# Patient Record
Sex: Male | Born: 1995 | Race: Black or African American | Hispanic: No | Marital: Single | Smoking: Current some day smoker
Health system: Southern US, Community
[De-identification: ages and names within clinical notes are randomized; demographics above are authoritative.]

## PROBLEM LIST (undated history)

## (undated) DIAGNOSIS — A549 Gonococcal infection, unspecified: Secondary | ICD-10-CM

---

## 2017-08-21 ENCOUNTER — Emergency Department (HOSPITAL_COMMUNITY)
Admission: EM | Admit: 2017-08-21 | Discharge: 2017-08-21 | Disposition: A | Discharge status: Home / Self Care | Payer: BLUE CROSS/BLUE SHIELD | Attending: Emergency Medicine | Admitting: Emergency Medicine

## 2017-08-21 ENCOUNTER — Other Ambulatory Visit: Payer: Self-pay

## 2017-08-21 ENCOUNTER — Encounter (HOSPITAL_COMMUNITY): Payer: Self-pay | Admitting: Emergency Medicine

## 2017-08-21 DIAGNOSIS — K6289 Other specified diseases of anus and rectum: Secondary | ICD-10-CM | POA: Insufficient documentation

## 2017-08-21 DIAGNOSIS — R208 Other disturbances of skin sensation: Secondary | ICD-10-CM

## 2017-08-21 LAB — URINALYSIS, ROUTINE W REFLEX MICROSCOPIC
Bilirubin Urine: NEGATIVE
Glucose, UA: NEGATIVE mg/dL
Hgb urine dipstick: NEGATIVE
Ketones, ur: NEGATIVE mg/dL
Leukocytes, UA: NEGATIVE
NITRITE: NEGATIVE
PH: 6 (ref 5.0–8.0)
Protein, ur: NEGATIVE mg/dL
SPECIFIC GRAVITY, URINE: 1.014 (ref 1.005–1.030)

## 2017-08-21 LAB — COMPREHENSIVE METABOLIC PANEL
ALT: 32 U/L (ref 17–63)
ANION GAP: 10 (ref 5–15)
AST: 37 U/L (ref 15–41)
Albumin: 4.2 g/dL (ref 3.5–5.0)
Alkaline Phosphatase: 73 U/L (ref 38–126)
BUN: 10 mg/dL (ref 6–20)
CALCIUM: 9.2 mg/dL (ref 8.9–10.3)
CO2: 27 mmol/L (ref 22–32)
Chloride: 102 mmol/L (ref 101–111)
Creatinine, Ser: 0.92 mg/dL (ref 0.61–1.24)
GLUCOSE: 79 mg/dL (ref 65–99)
POTASSIUM: 3.9 mmol/L (ref 3.5–5.1)
Sodium: 139 mmol/L (ref 135–145)
TOTAL PROTEIN: 7.5 g/dL (ref 6.5–8.1)
Total Bilirubin: 1.3 mg/dL — ABNORMAL HIGH (ref 0.3–1.2)

## 2017-08-21 LAB — CBC
HEMATOCRIT: 47.2 % (ref 39.0–52.0)
HEMOGLOBIN: 16.3 g/dL (ref 13.0–17.0)
MCH: 28.9 pg (ref 26.0–34.0)
MCHC: 34.5 g/dL (ref 30.0–36.0)
MCV: 83.7 fL (ref 78.0–100.0)
Platelets: 209 10*3/uL (ref 150–400)
RBC: 5.64 MIL/uL (ref 4.22–5.81)
RDW: 13.2 % (ref 11.5–15.5)
WBC: 7 10*3/uL (ref 4.0–10.5)

## 2017-08-21 LAB — LIPASE, BLOOD: LIPASE: 26 U/L (ref 11–51)

## 2017-08-21 MED ORDER — STARCH 51 % RE SUPP: 1.0000 | RECTAL | 0 refills | Status: DC | PRN

## 2017-08-21 NOTE — Discharge Instructions (Signed)
Blood work was normal.  Prescription for suppository for your rectal pain.  Try to get a primary care doctor.

## 2017-08-21 NOTE — ED Provider Notes (Deleted)
[facility] Provider Note   CSN: [id number] Arrival date & time: 08/21/17  1430     History   Chief Complaint Chief Complaint  Patient presents with  . Abdominal Pain    HPI Dorinda HillMalik Fadeley is a 22 y.o. male.  Lower abdominal pain for 2 weeks with associated rectal irritation scribed as a burning sensation.  Review of systems positive for a small amount of blood in stool.  He is normally healthy.  No chronic medical conditions.  No abdominal surgery.  He is eating normally.  No weight loss.  Severity is mild.  Nothing makes symptoms better or worse.      History reviewed. No pertinent past medical history.  There are no active problems to display for this patient.   History reviewed. No pertinent surgical history.     Home Medications    Prior to Admission medications   Medication Sig Start Date End Date Taking? Authorizing Provider  acetaminophen (TYLENOL) 500 MG tablet Take 500 mg by mouth every 6 (six) hours as needed for mild pain or moderate pain.   Yes [provider]  starch (ANUSOL) 51 % suppository Place 1 suppository rectally as needed for pain. 08/21/17   Donnetta Hutchingook, Fayth Trefry, MD    Family History History reviewed. No pertinent family history.  Social History Social History   Tobacco Use  . Smoking status: Never Smoker  . Smokeless tobacco: Never Used  Substance Use Topics  . Alcohol use: No    Frequency: Never  . Drug use: Not on file     Allergies   Patient has no known allergies.   Review of Systems Review of Systems  All other systems reviewed and are negative.    Physical Exam Updated Vital Signs BP 129/79   Pulse (!) 104   Temp 98.3 F (36.8 C) (Oral)   Resp 18   Ht 6' 0.5" (1.842 m)   Wt 91 kg (200 lb 9.6 oz)   SpO2 97%   BMI 26.83 kg/m   Physical Exam  Constitutional: He is oriented to person, place, and time. He appears well-developed and well-nourished.  HENT:  Head: Normocephalic and  atraumatic.  Eyes: Conjunctivae are normal.  Neck: Neck supple.  Cardiovascular: Normal rate and regular rhythm.  Pulmonary/Chest: Effort normal and breath sounds normal.  Abdominal: Soft. Bowel sounds are normal.  Genitourinary:  Genitourinary Comments: Rectal exam: No masses, heme-negative.  Musculoskeletal: Normal range of motion.  Neurological: He is alert and oriented to person, place, and time.  Skin: Skin is warm and dry.  Psychiatric: He has a normal mood and affect. His behavior is normal.  Nursing note and vitals reviewed.    ED Treatments / Results  Labs (all labs ordered are listed, but only abnormal results are displayed) Labs Reviewed  COMPREHENSIVE METABOLIC PANEL - Abnormal; Notable for the following components:      Result Value   Total Bilirubin 1.3 (*)    All other components within normal limits  LIPASE, BLOOD  CBC  URINALYSIS, ROUTINE W REFLEX MICROSCOPIC  POC OCCULT BLOOD, ED    EKG  EKG Interpretation None       Radiology No results found.  Procedures Procedures (including critical care time)  Medications Ordered in ED Medications - No data to display   Initial Impression / Assessment and Plan / ED Course  I have reviewed the triage vital signs and the nursing notes.  Pertinent labs & imaging results that were available during my  care of the patient were reviewed by me and considered in my medical decision making (see chart for details).     Patient has a normal physical exam.  Labs, rectal exam all normal.  Discharge medications Anusol HC suppository.  Final Clinical Impressions(s) / ED Diagnoses   Final diagnoses:  Rectal burning    ED Discharge Orders        Ordered    starch (ANUSOL) 51 % suppository  As needed     08/21/17 2140       Donnetta Hutching, MD 08/21/17 2202

## 2017-08-21 NOTE — ED Triage Notes (Signed)
Patient c/o lower abd pain x2 weeks. Denies any nausea, vomiting, or fevers. Per patient diarrhea and burning sensation in rectum prior to BM. Patient has noted intermittent, small amount of bright red blood in stools.

## 2017-08-21 NOTE — ED Notes (Signed)
Occult blood card negative.  Results did not transfer over from glucose monitor

## 2017-08-22 LAB — POC OCCULT BLOOD, ED: Fecal Occult Bld: NEGATIVE

## 2017-08-24 NOTE — ED Provider Notes (Signed)
[facility] Provider [facility]   CSN: [id number] Arrival date & time: 08/21/17  1430     History   Chief Complaint Chief Complaint  Patient presents with  . Abdominal Pain    HPI Chad Bolton is a 22 y.o. male.  Lower abdominal pain for 2 weeks with associated rectal burning.  Small amount of blood in stool.  He is eating normally.  No nausea, vomiting, diarrhea.  No weight loss.  No chronic health problems.  Severity of symptoms is mild.  Nothing makes symptoms better or worse.      History reviewed. No pertinent past medical history.  There are no active problems to display for this patient.   History reviewed. No pertinent surgical history.     Home Medications    Prior to Admission medications   Medication Sig Start Date End Date Taking? Authorizing Provider  acetaminophen (TYLENOL) 500 MG tablet Take 500 mg by mouth every 6 (six) hours as needed for mild pain or moderate pain.   Yes [provider]  starch (ANUSOL) 51 % suppository Place 1 suppository rectally as needed for pain. 08/21/17   Donnetta Hutchingook, Fumiko Cham, MD    Family History History reviewed. No pertinent family history.  Social History Social History   Tobacco Use  . Smoking status: Never Smoker  . Smokeless tobacco: Never Used  Substance Use Topics  . Alcohol use: No    Frequency: Never  . Drug use: Not on file     Allergies   Patient has no known allergies.   Review of Systems Review of Systems  All other systems reviewed and are negative.    Physical Exam Updated Vital Signs BP 129/79   Pulse (!) 104   Temp 98.3 F (36.8 C) (Oral)   Resp 18   Ht 6' 0.5" (1.842 m)   Wt 91 kg (200 lb 9.6 oz)   SpO2 97%   BMI 26.83 kg/m   Physical Exam  Constitutional: He is oriented to person, place, and time. He appears well-developed and well-nourished.  HENT:  Head: Normocephalic and atraumatic.  Eyes: Conjunctivae are normal.  Neck: Neck supple.  Cardiovascular:  Normal rate and regular rhythm.  Pulmonary/Chest: Effort normal and breath sounds normal.  Abdominal: Soft. Bowel sounds are normal.  Genitourinary:  Genitourinary Comments: Rectal exam: No masses, heme-negative  Musculoskeletal: Normal range of motion.  Neurological: He is alert and oriented to person, place, and time.  Skin: Skin is warm and dry.  Psychiatric: He has a normal mood and affect. His behavior is normal.  Nursing note and vitals reviewed.    ED Treatments / Results  Labs (all labs ordered are listed, but only abnormal results are displayed) Labs Reviewed  COMPREHENSIVE METABOLIC PANEL - Abnormal; Notable for the following components:      Result Value   Total Bilirubin 1.3 (*)    All other components within normal limits  LIPASE, BLOOD  CBC  URINALYSIS, ROUTINE W REFLEX MICROSCOPIC  POC OCCULT BLOOD, ED    EKG  EKG Interpretation None       Radiology No results found.  Procedures Procedures (including critical care time)  Medications Ordered in ED Medications - No data to display   Initial Impression / Assessment and Plan / ED Course  I have reviewed the triage vital signs and the nursing notes.  Pertinent labs & imaging results that were available during my care of the patient were reviewed by me and considered in my medical  decision making (see chart for details).     Patient is in no acute distress.  Rectal exam revealed no obvious acute pathology.  Hemoglobin and occult blood normal.  Discharge medications Anusol suppository.  Final Clinical Impressions(s) / ED Diagnoses   Final diagnoses:  Rectal burning    ED Discharge Orders        Ordered    starch (ANUSOL) 51 % suppository  As needed     08/21/17 2140       Donnetta Hutching, MD 08/24/17 [phone]

## 2018-07-21 ENCOUNTER — Other Ambulatory Visit: Payer: Self-pay

## 2018-07-21 ENCOUNTER — Emergency Department (HOSPITAL_COMMUNITY)
Admission: EM | Admit: 2018-07-21 | Discharge: 2018-07-21 | Disposition: A | Discharge status: Home / Self Care | Payer: BLUE CROSS/BLUE SHIELD | Attending: Emergency Medicine | Admitting: Emergency Medicine

## 2018-07-21 ENCOUNTER — Encounter (HOSPITAL_COMMUNITY): Payer: Self-pay | Admitting: *Deleted

## 2018-07-21 DIAGNOSIS — J020 Streptococcal pharyngitis: Secondary | ICD-10-CM | POA: Diagnosis not present

## 2018-07-21 DIAGNOSIS — J029 Acute pharyngitis, unspecified: Secondary | ICD-10-CM | POA: Diagnosis present

## 2018-07-21 LAB — GROUP A STREP BY PCR: Group A Strep by PCR: DETECTED — AB

## 2018-07-21 MED ORDER — PENICILLIN G BENZATHINE & PROC 1200000 UNIT/2ML IM SUSP
1.2000 10*6.[IU] | Freq: Once | INTRAMUSCULAR | Status: AC
  Administered 2018-07-21: 1.2 10*6.[IU] via INTRAMUSCULAR
  Filled 2018-07-21: qty 2

## 2018-07-21 MED ORDER — ACETAMINOPHEN 325 MG PO TABS
650.0000 mg | ORAL_TABLET | Freq: Once | ORAL | Status: AC
  Administered 2018-07-21: 650 mg via ORAL
  Filled 2018-07-21: qty 2

## 2018-07-21 MED ORDER — LIDOCAINE VISCOUS HCL 2 % MT SOLN: 15.0000 mL | OROMUCOSAL | 0 refills | Status: DC | PRN

## 2018-07-21 MED ORDER — DEXAMETHASONE 1 MG/ML PO CONC
10.0000 mg | Freq: Once | ORAL | Status: AC
  Administered 2018-07-21: 10 mg via ORAL
  Filled 2018-07-21: qty 10

## 2018-07-21 NOTE — Discharge Instructions (Addendum)
You have been diagnosed today with Strep Throat.  At this time there does not appear to be the presence of an emergent medical condition, however there is always the potential for conditions to change. Please read and follow the below instructions.  Please return to the Emergency Department immediately for any new or worsening symptoms or if your symptoms do not improve within 48 hours. Please be sure to follow up with your Primary Care Provider within 5 days regarding your visit today; please call their office to schedule an appointment even if you are feeling better for a follow-up visit. You may use the viscous lidocaine mouthwash to help with your sore throat.  Do not swallow this medication. You have been treated today with antibiotic medication penicillin.  Your symptoms should start to improve in the next 24 hours, if your symptoms have not improved in the next 48 hours please return to the emergency department as further evaluation may be necessary.  Get help right away if: You have new symptoms, such as vomiting, severe headache, stiff or painful neck, chest pain, or shortness of breath. You have severe throat pain, drooling, or changes in your voice. You have swelling of the neck, or the skin on the neck becomes red and tender. You have signs of dehydration, such as fatigue, dry mouth, and decreased urination. You become increasingly sleepy, or you cannot wake up completely. Your joints become red or painful.  Please read the additional information packets attached to your discharge summary.  Do not take your medicine if  develop an itchy rash, swelling in your mouth or lips, or difficulty breathing.

## 2018-07-21 NOTE — ED Provider Notes (Signed)
[facility] Provider Note   CSN: [id number] Arrival date & time: 07/21/18  [id number]     History   Chief Complaint Chief Complaint  Patient presents with  . Sore Throat    HPI Chad Bolton is a 22 y.o. male presents today for a 4-day history of sore throat.  Patient states that his sore throat has gradually gotten worse since onset.  Describes it as a bilateral mild intensity constant burning sensation worse with swallowing.  Patient has been using NyQuil with some relief, last NyQuil dose last night.  States that he has been otherwise feeling well, denies fever, nausea/vomiting or abdominal pain.  Patient states that he has been eating and drinking without difficulty, last oral intake just prior to arrival.  HPI  History reviewed. No pertinent past medical history.  There are no active problems to display for this patient.   History reviewed. No pertinent surgical history.      Home Medications    Prior to Admission medications   Medication Sig Start Date End Date Taking? Authorizing Provider  acetaminophen (TYLENOL) 500 MG tablet Take 500 mg by mouth every 6 (six) hours as needed for mild pain or moderate pain.    [provider]  lidocaine (XYLOCAINE) 2 % solution Use as directed 15 mLs in the mouth or throat as needed for mouth pain (Do NOT Swallow). 07/21/18   Harlene SaltsMorelli, Javion Holmer A, PA-C  starch (ANUSOL) 51 % suppository Place 1 suppository rectally as needed for pain. 08/21/17   Donnetta Hutchingook, Brian, MD    Family History History reviewed. No pertinent family history.  Social History Social History   Tobacco Use  . Smoking status: Never Smoker  . Smokeless tobacco: Never Used  Substance Use Topics  . Alcohol use: No    Frequency: Never  . Drug use: Never     Allergies   Oxycodone   Review of Systems Review of Systems  Constitutional: Negative.  Negative for activity change, appetite change, chills and fever.  HENT:  Positive for congestion, rhinorrhea and sore throat. Negative for drooling, facial swelling, trouble swallowing and voice change.   Respiratory: Negative.  Negative for cough, choking and shortness of breath.   Gastrointestinal: Negative.  Negative for abdominal pain, nausea and vomiting.  Musculoskeletal: Negative.  Negative for arthralgias and myalgias.  Neurological: Negative.  Negative for dizziness, weakness, numbness and headaches.   Physical Exam Updated Vital Signs BP 126/85   Pulse 96   Temp 98.3 F (36.8 C) (Oral)   Resp 16   Ht 6' (1.829 m)   Wt 83.9 kg   SpO2 98%   BMI 25.09 kg/m   Physical Exam  Constitutional: He appears well-developed and well-nourished. No distress.  HENT:  Head: Normocephalic and atraumatic.  Right Ear: Hearing, tympanic membrane, external ear and ear canal normal.  Left Ear: Hearing, tympanic membrane, external ear and ear canal normal.  Nose: Mucosal edema and rhinorrhea present.  Mouth/Throat: Uvula is midline. No trismus in the jaw. No uvula swelling. No tonsillar abscesses. Tonsils are 2+ on the right. Tonsils are 2+ on the left.  The patient has normal phonation and is in control of secretions. No stridor.  Midline uvula without edema. Soft palate rises symmetrically.   Tonsillar erythema present, moderate tonsillar swelling equal bilaterally, no exudates.  Airway widely patent.  Tonsils not touching uvula.  Tongue protrusion is normal, floor of mouth is soft. No trismus. No creptius on neck palpation. No gingival erythema  or fluctuance noted. Mucus membranes moist.  Eyes: Pupils are equal, round, and reactive to light. Conjunctivae and EOM are normal.  Neck: Trachea normal, normal range of motion, full passive range of motion without pain and phonation normal. Neck supple. No tracheal tenderness present. No neck rigidity. No tracheal deviation, no edema and no erythema present.  Cardiovascular: Normal rate, regular rhythm and normal heart  sounds.  Pulmonary/Chest: Effort normal and breath sounds normal. No respiratory distress. He exhibits no tenderness, no crepitus and no deformity.  Abdominal: Soft. There is no tenderness. There is no rigidity, no rebound and no guarding.  Musculoskeletal: Normal range of motion.  Moving all extremities spontaneously without distress.  Neurological: He is alert. GCS eye subscore is 4. GCS verbal subscore is 5. GCS motor subscore is 6.  Speech is clear and goal oriented, follows commands Major Cranial nerves without deficit, no facial droop Moves extremities without ataxia, coordination intact Normal gait  Skin: Skin is warm and dry.  Psychiatric: He has a normal mood and affect. His behavior is normal.   ED Treatments / Results  Labs (all labs ordered are listed, but only abnormal results are displayed) Labs Reviewed  GROUP A STREP BY PCR - Abnormal; Notable for the following components:      Result Value   Group A Strep by PCR DETECTED (*)    All other components within normal limits    EKG None  Radiology No results found.  Procedures Procedures (including critical care time)  Medications Ordered in ED Medications  acetaminophen (TYLENOL) tablet 650 mg (650 mg Oral Given 07/21/18 1023)  penicillin g procaine-penicillin g benzathine (BICILLIN-CR) injection 600000-600000 units (1.2 Million Units Intramuscular Given 07/21/18 1102)  dexamethasone (DECADRON) 1 MG/ML solution 10 mg (10 mg Oral Given 07/21/18 1102)     Initial Impression / Assessment and Plan / ED Course  I have reviewed the triage vital signs and the nursing notes.  Pertinent labs & imaging results that were available during my care of the patient were reviewed by me and considered in my medical decision making (see chart for details).    22 year old otherwise healthy male presenting today for 4-day history of sore throat.  Strep test positive.  Treated in the Ed with Decadron, Tylenol and 1,200,000 units  penicillin G intramuscular given.  Discussed importance of water rehydration.  Presentation not concerning for peritonsillar abscess, Ludwig's angina, retropharyngeal abscess, preseptal/orbital cellulitis or other deep tissue infections of the head or neck.  No trismus or uvula deviation. Specific return precautions discussed. Pt able to drink water and eat in ED without difficulty with intact air way.  Viscous lidocaine given for symptomatic relief.  At discharge patient is afebrile, not tachycardic, not hypotensive, well-appearing and in no acute distress.   At this time there does not appear to be any evidence of an acute emergency medical condition and the patient appears stable for discharge with appropriate outpatient follow up. Diagnosis was discussed with patient who verbalizes understanding of care plan and is agreeable to discharge. I have discussed return precautions with patient who verbalize understanding of return precautions.  Patient informed to return to the emergency department if symptoms not improved within 48 hours.  Patient strongly encouraged to follow-up with their PCP within 5 days. All questions answered.  Note: Portions of this report may have been transcribed using voice recognition software. Every effort was made to ensure accuracy; however, inadvertent computerized transcription errors may still be present. Final Clinical Impressions(s) / ED  Diagnoses   Final diagnoses:  Pharyngitis due to Streptococcus species    ED Discharge Orders         Ordered    lidocaine (XYLOCAINE) 2 % solution  As needed     07/21/18 1112           Elizabeth Palau 07/21/18 1139    Doug Sou, MD 07/21/18 1649

## 2018-07-21 NOTE — ED Triage Notes (Signed)
Pt reports sore throat for 4 days . Pt reports throat is painful to swallow.

## 2018-09-10 ENCOUNTER — Emergency Department (HOSPITAL_COMMUNITY): Payer: BLUE CROSS/BLUE SHIELD

## 2018-09-10 ENCOUNTER — Other Ambulatory Visit: Payer: Self-pay

## 2018-09-10 ENCOUNTER — Encounter (HOSPITAL_COMMUNITY): Payer: Self-pay

## 2018-09-10 ENCOUNTER — Inpatient Hospital Stay (HOSPITAL_COMMUNITY)
Admission: EM | Admit: 2018-09-10 | Discharge: 2018-09-13 | DRG: 378 | Disposition: A | Discharge status: Home / Self Care | Payer: BLUE CROSS/BLUE SHIELD | Attending: Nephrology | Admitting: Nephrology

## 2018-09-10 DIAGNOSIS — R55 Syncope and collapse: Secondary | ICD-10-CM | POA: Diagnosis not present

## 2018-09-10 DIAGNOSIS — D62 Acute posthemorrhagic anemia: Secondary | ICD-10-CM | POA: Diagnosis present

## 2018-09-10 DIAGNOSIS — K626 Ulcer of anus and rectum: Secondary | ICD-10-CM | POA: Diagnosis present

## 2018-09-10 DIAGNOSIS — Z7251 High risk heterosexual behavior: Secondary | ICD-10-CM | POA: Diagnosis not present

## 2018-09-10 DIAGNOSIS — K625 Hemorrhage of anus and rectum: Secondary | ICD-10-CM | POA: Diagnosis present

## 2018-09-10 DIAGNOSIS — K922 Gastrointestinal hemorrhage, unspecified: Secondary | ICD-10-CM | POA: Diagnosis not present

## 2018-09-10 DIAGNOSIS — F319 Bipolar disorder, unspecified: Secondary | ICD-10-CM | POA: Diagnosis present

## 2018-09-10 DIAGNOSIS — Z885 Allergy status to narcotic agent status: Secondary | ICD-10-CM | POA: Diagnosis not present

## 2018-09-10 DIAGNOSIS — K581 Irritable bowel syndrome with constipation: Secondary | ICD-10-CM | POA: Diagnosis present

## 2018-09-10 HISTORY — DX: Gonococcal infection, unspecified: A54.9

## 2018-09-10 LAB — COMPREHENSIVE METABOLIC PANEL
ALK PHOS: 72 U/L (ref 38–126)
ALT: 21 U/L (ref 0–44)
ANION GAP: 8 (ref 5–15)
AST: 19 U/L (ref 15–41)
Albumin: 4.1 g/dL (ref 3.5–5.0)
BUN: 14 mg/dL (ref 6–20)
CO2: 25 mmol/L (ref 22–32)
Calcium: 8.6 mg/dL — ABNORMAL LOW (ref 8.9–10.3)
Chloride: 104 mmol/L (ref 98–111)
Creatinine, Ser: 0.9 mg/dL (ref 0.61–1.24)
GFR calc non Af Amer: >60 mL/min (ref >60)
Glucose, Bld: 94 mg/dL (ref 70–99)
POTASSIUM: 4 mmol/L (ref 3.5–5.1)
SODIUM: 137 mmol/L (ref 135–145)
TOTAL PROTEIN: 6.9 g/dL (ref 6.5–8.1)
Total Bilirubin: 0.8 mg/dL (ref 0.3–1.2)

## 2018-09-10 LAB — POC OCCULT BLOOD, ED: FECAL OCCULT BLD: POSITIVE — AB

## 2018-09-10 LAB — TYPE AND SCREEN
ABO/RH(D): O POS
Antibody Screen: NEGATIVE

## 2018-09-10 LAB — PROTIME-INR
INR: 1.04
Prothrombin Time: 13.5 seconds (ref 11.4–15.2)

## 2018-09-10 LAB — CBC
HCT: 46.1 % (ref 39.0–52.0)
HEMOGLOBIN: 15.6 g/dL (ref 13.0–17.0)
MCH: 29.1 pg (ref 26.0–34.0)
MCHC: 33.8 g/dL (ref 30.0–36.0)
MCV: 85.8 fL (ref 80.0–100.0)
NRBC: 0 % (ref 0.0–0.2)
Platelets: 234 10*3/uL (ref 150–400)
RBC: 5.37 MIL/uL (ref 4.22–5.81)
RDW: 13 % (ref 11.5–15.5)
WBC: 10.4 10*3/uL (ref 4.0–10.5)

## 2018-09-10 LAB — HEMOGLOBIN AND HEMATOCRIT, BLOOD
HCT: 35.6 % — ABNORMAL LOW (ref 39.0–52.0)
Hemoglobin: 12.2 g/dL — ABNORMAL LOW (ref 13.0–17.0)

## 2018-09-10 LAB — ABO/RH: ABO/RH(D): O POS

## 2018-09-10 MED ORDER — MORPHINE SULFATE (PF) 2 MG/ML IV SOLN
1.0000 mg | INTRAVENOUS | Status: DC | PRN
  Administered 2018-09-11: 1 mg via INTRAVENOUS
  Filled 2018-09-10: qty 1

## 2018-09-10 MED ORDER — SODIUM CHLORIDE 0.9 % IV BOLUS
1000.0000 mL | Freq: Once | INTRAVENOUS | Status: AC
  Administered 2018-09-10: 1000 mL via INTRAVENOUS

## 2018-09-10 MED ORDER — IOPAMIDOL (ISOVUE-300) INJECTION 61%
INTRAVENOUS | Status: AC
  Filled 2018-09-10: qty 100

## 2018-09-10 MED ORDER — SODIUM CHLORIDE (PF) 0.9 % IJ SOLN
INTRAMUSCULAR | Status: AC
  Filled 2018-09-10: qty 50

## 2018-09-10 MED ORDER — ONDANSETRON HCL 4 MG/2ML IJ SOLN: 4.0000 mg | Freq: Four times a day (QID) | INTRAMUSCULAR | Status: DC | PRN

## 2018-09-10 MED ORDER — IOPAMIDOL (ISOVUE-300) INJECTION 61%
100.0000 mL | Freq: Once | INTRAVENOUS | Status: AC | PRN
Start: 2018-09-10 — End: 2018-09-10
  Administered 2018-09-10: 100 mL via INTRAVENOUS

## 2018-09-10 NOTE — ED Notes (Addendum)
This RN heard stumbling coming from patients room. Patient Aunt was trying to hold patient up from falling in the floor. Aunt states he just got back from bathroom. Patient walked by himself to bathroom with no assistance and did not call out. When patient came back to room he had a syncopal episode and aunt caught him. Denies Hitting head. This RN assisted patient back into bed and called for help. Patient was responsive once this RN walked into room and able to answer questions. Repositioned patient and fluids are running. Judeth Cornfield, RN notified as well as MD. This RN made patient aware and Aunt to not get up alone. Patient refused bed pan and bedside commode. Wheelchair at bedside and advised to call for help when needing to go to bathroom. Blood pressure obtained.

## 2018-09-10 NOTE — ED Triage Notes (Signed)
Pt presents with c/o rectal bleeding. Pt reports that he has had some abdominal pain throughout the day and approx 15 minutes ago, he passed a large amount of rectal blood. Pt reports that he just now had another episode of the same.
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
## 2018-09-10 NOTE — ED Notes (Signed)
Butler, MD at bedside.  

## 2018-09-10 NOTE — ED Provider Notes (Signed)
[facility]-EMERGENCY DEPT Provider Note   CSN: [id number] Arrival date & time: 09/10/18  1532     History   Chief Complaint Chief Complaint  Patient presents with  . Rectal Bleeding    HPI Chad Bolton is a 23 y.o. male.  He is presenting to the emergency department with complaint of stomach problems and a been going on since September.  He says 2 or 3 times a week he will have a little bit of blood when he wipes.  He has had some intermittent abdominal pain.  Says he [place] in [place] believe and had a colonoscopy a few months ago that was normal.  Today at work here he said his abdominal pain was worse and he felt like he needed to immediately move his bowels.  He said he had explosive blood with clots in the bowl.  Is it happens a couple more times and he felt he may have even passed out in the bathroom.  York SpanielSaid he struck his head.  Currently his abdominal pain is better although he feels like he needs to have another bloody bowel movement.  Never had this level of bleeding before.  He denies any fevers cough chest pain shortness of breath nausea or vomiting.  He is not on any blood thinners.  He takes intermittent ibuprofen.  He said he has not had any anal sex recently.  The history is provided by the patient.  Rectal Bleeding  Quality:  Bright red Amount:  Copious Timing:  Constant Chronicity:  Recurrent Context: anal penetration (remote) and defecation   Similar prior episodes: yes (much more minor)   Relieved by:  Nothing Worsened by:  Defecation Ineffective treatments:  None tried Associated symptoms: abdominal pain, dizziness, light-headedness and loss of consciousness   Associated symptoms: no epistaxis, no fever, no hematemesis, no recent illness and no vomiting   Risk factors: NSAID use   Risk factors: no anticoagulant use, no hx of colorectal cancer, no hx of colorectal surgery, no hx of IBD, no liver disease and no  steroid use     History reviewed. No pertinent past medical history.  Patient Active Problem List   Diagnosis Date Noted  . GI bleed 09/10/2018    History reviewed. No pertinent surgical history.      Home Medications    Prior to Admission medications   Medication Sig Start Date End Date Taking? Authorizing Provider  acetaminophen (TYLENOL) 500 MG tablet Take 500 mg by mouth every 6 (six) hours as needed for mild pain or moderate pain.    [provider]  lidocaine (XYLOCAINE) 2 % solution Use as directed 15 mLs in the mouth or throat as needed for mouth pain (Do NOT Swallow). 07/21/18   Harlene SaltsMorelli, Brandon A, PA-C  starch (ANUSOL) 51 % suppository Place 1 suppository rectally as needed for pain. 08/21/17   Donnetta Hutchingook, Brian, MD    Family History History reviewed. No pertinent family history.  Social History Social History   Tobacco Use  . Smoking status: Never Smoker  . Smokeless tobacco: Never Used  Substance Use Topics  . Alcohol use: No    Frequency: Never  . Drug use: Never     Allergies   Oxycodone   Review of Systems Review of Systems  Constitutional: Negative for fever.  HENT: Negative for nosebleeds and sore throat.   Eyes: Negative for visual disturbance.  Respiratory: Negative for shortness of breath.   Cardiovascular: Negative  for chest pain.  Gastrointestinal: Positive for abdominal pain and hematochezia. Negative for hematemesis and vomiting.  Genitourinary: Negative for dysuria.  Musculoskeletal: Negative for back pain.  Skin: Negative for rash.  Neurological: Positive for dizziness, loss of consciousness, syncope and light-headedness.     Physical Exam Updated Vital Signs BP (!) 142/99 (BP Location: Right Arm)   Pulse 98   Temp 98.2 F (36.8 C) (Oral)   Resp 18   Ht 6' (1.829 m)   Wt 83.9 kg   SpO2 100%   BMI 25.09 kg/m   Physical Exam Vitals signs and nursing note reviewed.  Constitutional:      Appearance: He is well-developed.   HENT:     Head: Normocephalic and atraumatic.  Eyes:     Conjunctiva/sclera: Conjunctivae normal.  Neck:     Musculoskeletal: Neck supple.  Cardiovascular:     Rate and Rhythm: Normal rate and regular rhythm.     Heart sounds: No murmur.  Pulmonary:     Effort: Pulmonary effort is normal. No respiratory distress.     Breath sounds: Normal breath sounds.  Abdominal:     Palpations: Abdomen is soft.     Tenderness: There is no abdominal tenderness.  Musculoskeletal: Normal range of motion.        General: No swelling, tenderness, deformity or signs of injury.  Skin:    General: Skin is warm and dry.     Capillary Refill: Capillary refill takes less than 2 seconds.  Neurological:     General: No focal deficit present.     Mental Status: He is alert and oriented to person, place, and time.     Sensory: No sensory deficit.     Motor: No weakness.      ED Treatments / Results  Labs (all labs ordered are listed, but only abnormal results are displayed) Labs Reviewed  COMPREHENSIVE METABOLIC PANEL - Abnormal; Notable for the following components:      Result Value   Calcium 8.6 (*)    All other components within normal limits  HEMOGLOBIN AND HEMATOCRIT, BLOOD - Abnormal; Notable for the following components:   Hemoglobin 12.2 (*)    HCT 35.6 (*)    All other components within normal limits  POC OCCULT BLOOD, ED - Abnormal; Notable for the following components:   Fecal Occult Bld POSITIVE (*)    All other components within normal limits  CBC  PROTIME-INR  HIV ANTIBODY (ROUTINE TESTING W REFLEX)  URINALYSIS, ROUTINE W REFLEX MICROSCOPIC  CBC  BASIC METABOLIC PANEL  TYPE AND SCREEN  ABO/RH  GC/CHLAMYDIA PROBE AMP ([place]) NOT AT [facility]    EKG None  Radiology Ct Abdomen Pelvis W Contrast  Result Date: 09/10/2018 CLINICAL DATA:  Rectal bleeding. Right lower quadrant abdominal pain throughout the day with rectal bleeding 15 minutes ago. EXAM: CT ABDOMEN AND PELVIS  WITH CONTRAST TECHNIQUE: Multidetector CT imaging of the abdomen and pelvis was performed using the standard protocol following bolus administration of intravenous contrast. CONTRAST:  ISOVUE-300 IOPAMIDOL (ISOVUE-300) INJECTION 61% COMPARISON:  None FINDINGS: Lower chest: No acute abnormality. Hepatobiliary: There is a geographic region of low attenuation in the left hepatic lobe on series 2, image 20 and coronal image 33, likely either focal fatty deposition or a perfusion variant. The finding does not appear particularly masslike. Hepatic steatosis. The liver is otherwise normal. The gallbladder is unremarkable. The portal vein is patent. Pancreas: Unremarkable. No pancreatic ductal dilatation or surrounding inflammatory changes. Spleen: Normal  in size without focal abnormality. Adrenals/Urinary Tract: Adrenal glands are normal. There is a cyst in the lower right kidney with an attenuation of 14 Hounsfield units. No suspicious masses, stones, hydronephrosis, or perinephric stranding. No ureterectasis or ureteral stone. The bladder is normal. Stomach/Bowel: The stomach and small bowel are normal. There is high attenuation material in the rectum, likely active bleeding given history. The remainder of the colon is normal. The appendix is well seen with no evidence of appendicitis. Vascular/Lymphatic: No significant vascular findings are present. No enlarged abdominal or pelvic lymph nodes. Reproductive: Prostate is unremarkable. Other: No abdominal wall hernia or abnormality. No abdominopelvic ascites. Musculoskeletal: No acute or significant osseous findings. IMPRESSION: 1. High attenuation in the rectum is consistent with active bleeding from the rectum given history. No underlying cause for the bleeding is identified. 2. No other acute abnormalities. Electronically Signed   By: Gerome Samavid  Williams III M.D   On: 09/10/2018 18:14    Procedures .Critical Care Performed by: Terrilee FilesButler, Michael C, MD Authorized  by: Terrilee FilesButler, Michael C, MD   Critical care provider statement:    Critical care time (minutes):  35   Critical care time was exclusive of:  Separately billable procedures and treating other patients   Critical care was necessary to treat or prevent imminent or life-threatening deterioration of the following conditions:  Circulatory failure   Critical care was time spent personally by me on the following activities:  Discussions with consultants, evaluation of patient's response to treatment, examination of patient, ordering and performing treatments and interventions, ordering and review of laboratory studies, ordering and review of radiographic studies, pulse oximetry, re-evaluation of patient's condition, obtaining history from patient or surrogate, review of old charts and development of treatment plan with patient or surrogate   I assumed direction of critical care for this patient from another provider in my specialty: no     (including critical care time)  Medications Ordered in ED Medications  sodium chloride 0.9 % bolus 1,000 mL (has no administration in time range)     Initial Impression / Assessment and Plan / ED Course  I have reviewed the triage vital signs and the nursing notes.  Pertinent labs & imaging results that were available during my care of the patient were reviewed by me and considered in my medical decision making (see chart for details).  Clinical Course as of Sep 11 2307  Sun Sep 10, 2018  1658 GI workup [facility] - Tami RibasKatopes, Charles P, MD - 06/19/2018 1:34 PM EST Flexible sigmoidoscopy completed. Please see sigmoidoscopy note under the Procedures and/or media tab dated 06/19/2018.   Impression/Plan: Subtle nodularity and erythema were noted in the rectum. This did not appear to be proctitis. Multiple cold forceps biopsies were performed for histology in the rectum.  -await pathology -If biopsies do not reveal a definitive culprit behind his bleeding, I feel an ID  consult will be warranted to assure this subtle nodularity and his bleeding isn't the result of an ongoing STD    [MB]  1829 Felt that the patient had another 4-5 episodes of frank blood in the bowl although the nurse that she did not see clot.  He was orthostatic and felt like he was in a pass out on returning from last 1.  I did a rectal exam and there was no obvious external hemorrhoids or masses palpated.  There was red blood on the glove. Chaperone was present during exam.    [MB]  1926 Discussed with Dr.  Brahmbadt from GI who recommends that the patient be obstinate and they will see tomorrow for possible flex sig or full colonoscopy.  Hospitalist paged for admission.   [MB]  1953 Discussed with Dr. Willaim Bane from the hospitalist service who will admit the patient. Patient has been updated on plan and all questions answered.   [MB]    Clinical Course User Index [MB] Terrilee Files, MD     Final Clinical Impressions(s) / ED Diagnoses   Final diagnoses:  Rectal bleeding  Near syncope    ED Discharge Orders    None       Terrilee Files, MD 09/10/18 2310

## 2018-09-10 NOTE — ED Notes (Signed)
ED TO INPATIENT HANDOFF REPORT  Name/Age/Gender Chad Bolton 23 y.o. male  Code Status    Code Status Orders  (From admission, onward)         Start     Ordered   09/10/18 2004  Full code  Continuous     09/10/18 2004        Code Status History    This patient has a current code status but no historical code status.      Home/SNF/Other Home  Chief Complaint GI bleed  Level of Care/Admitting Diagnosis ED Disposition    ED Disposition Condition Comment   Admit  [facility] [[id number]]  Level of Care: Med-Surg [16]  Diagnosis: GI bleed [[id number]][[id number]]  Admitting Physician: Ike BenePARK, CAROLYN J [[id number]]  Attending Physician: Ike BeneRK, CAROLYN J [[id number]]  Estimated length of stay: inpatient only procedure  Certification:: I certify this patient will need inpatient services for at least 2 midnights  PT Class (Do Not Modify): Inpatient [101]  PT Acc Code (Do Not Modify): Private [1]       Medical History Past Medical History:  Diagnosis Date  . Gonorrhea    resolved (2019)    Allergies Allergies  Allergen Reactions  . Oxycodone Nausea Only and Other (See Comments)    ABD cramping    IV Location/Drains/Wounds Patient Lines/Drains/Airways Status   Active Line/Drains/Airways    Name:   Placement date:   Placement time:   Site:   Days:   Peripheral IV 09/10/18 Left Antecubital   09/10/18    1732    Antecubital   less than 1          Labs/Imaging Results for orders placed or performed during the hospital encounter of 09/10/18 (from the past 48 hour(s))  Comprehensive metabolic panel     Status: Abnormal   Collection Time: 09/10/18  3:39 PM  Result Value Ref Range   Sodium 137 135 - 145 mmol/L   Potassium 4.0 3.5 - 5.1 mmol/L   Chloride 104 98 - 111 mmol/L   CO2 25 22 - 32 mmol/L   Glucose, Bld 94 70 - 99 mg/dL   BUN 14 6 - 20 mg/dL   Creatinine, Ser 2.950.90 0.61 - 1.24 mg/dL   Calcium 8.6 (L) 8.9 - 10.3 mg/dL   Total Protein 6.9  6.5 - 8.1 g/dL   Albumin 4.1 3.5 - 5.0 g/dL   AST 19 15 - 41 U/L   ALT 21 0 - 44 U/L   Alkaline Phosphatase 72 38 - 126 U/L   Total Bilirubin 0.8 0.3 - 1.2 mg/dL   GFR calc non Af Amer >60 >60 mL/min   GFR calc Af Amer >60 >60 mL/min   Anion gap 8 5 - 15    Comment: Performed at [facility], [address] [place], [place] [address]  CBC     Status: None   Collection Time: 09/10/18  3:39 PM  Result Value Ref Range   WBC 10.4 4.0 - 10.5 K/uL   RBC 5.37 4.22 - 5.81 MIL/uL   Hemoglobin 15.6 13.0 - 17.0 g/dL   HCT 86.546.1 78.439.0 - 69.652.0 %   MCV 85.8 80.0 - 100.0 fL   MCH 29.1 26.0 - 34.0 pg   MCHC 33.8 30.0 - 36.0 g/dL   RDW 29.513.0 28.411.5 - 13.215.5 %   Platelets 234 150 - 400 K/uL   nRBC 0.0 0.0 - 0.2 %    Comment: Performed at [facility], [address] [place], [place] [address]  Type and screen [facility]     Status: None   Collection Time: 09/10/18  3:39 PM  Result Value Ref Range   ABO/RH(D) O POS    Antibody Screen NEG    Sample Expiration      09/13/2018 Performed at [facility], [address] [place], [place] [address]   ABO/Rh     Status: None   Collection Time: 09/10/18  4:00 PM  Result Value Ref Range   ABO/RH(D)      O POS Performed at [facility], [address], [place], [place] [address]   Protime-INR     Status: None   Collection Time: 09/10/18  4:32 PM  Result Value Ref Range   Prothrombin Time 13.5 11.4 - 15.2 seconds   INR 1.04     Comment: Performed at [facility], [address] [place], [place] [address]  POC occult blood, ED     Status: Abnormal   Collection Time: 09/10/18  6:33 PM  Result Value Ref Range   Fecal Occult Bld POSITIVE (A) NEGATIVE  Hemoglobin and hematocrit, blood     Status: Abnormal   Collection Time: 09/10/18  7:18 PM  Result Value Ref Range   Hemoglobin 12.2 (L) 13.0 - 17.0 g/dL   HCT 70.1 (L) 77.9 - 39.0 %     Comment: Performed at [facility], [address], [place], [place] [address]   Ct Abdomen Pelvis W Contrast  Result Date: 09/10/2018 CLINICAL DATA:  Rectal bleeding. Right lower quadrant abdominal pain throughout the day with rectal bleeding 15 minutes ago. EXAM: CT ABDOMEN AND PELVIS WITH CONTRAST TECHNIQUE: Multidetector CT imaging of the abdomen and pelvis was performed using the standard protocol following bolus administration of intravenous contrast. CONTRAST:  ISOVUE-300 IOPAMIDOL (ISOVUE-300) INJECTION 61% COMPARISON:  None FINDINGS: Lower chest: No acute abnormality. Hepatobiliary: There is a geographic region of low attenuation in the left hepatic lobe on series 2, image 20 and coronal image 33, likely either focal fatty deposition or a perfusion variant. The finding does not appear particularly masslike. Hepatic steatosis. The liver is otherwise normal. The gallbladder is unremarkable. The portal vein is patent. Pancreas: Unremarkable. No pancreatic ductal dilatation or surrounding inflammatory changes. Spleen: Normal in size without focal abnormality. Adrenals/Urinary Tract: Adrenal glands are normal. There is a cyst in the lower right kidney with an attenuation of 14 Hounsfield units. No suspicious masses, stones, hydronephrosis, or perinephric stranding. No ureterectasis or ureteral stone. The bladder is normal. Stomach/Bowel: The stomach and small bowel are normal. There is high attenuation material in the rectum, likely active bleeding given history. The remainder of the colon is normal. The appendix is well seen with no evidence of appendicitis. Vascular/Lymphatic: No significant vascular findings are present. No enlarged abdominal or pelvic lymph nodes. Reproductive: Prostate is unremarkable. Other: No abdominal wall hernia or abnormality. No abdominopelvic ascites. Musculoskeletal: No acute or significant osseous findings. IMPRESSION: 1. High attenuation in the rectum  is consistent with active bleeding from the rectum given history. No underlying cause for the bleeding is identified. 2. No other acute abnormalities. Electronically Signed   By: Gerome Sam III M.D   On: 09/10/2018 18:14   None  Pending Labs Unresulted Labs (From admission, onward)    Start     Ordered   09/11/18 2300  CBC  Once-Timed,   R  09/10/18 2004   09/11/18 0500  CBC  Daily,   R     09/10/18 2004   09/11/18 0500  Basic metabolic panel  Daily,   R     09/10/18 2004   09/10/18 2004  Urinalysis, Routine w reflex microscopic  Once,   R     09/10/18 2004   09/10/18 2003  HIV antibody (Routine Testing)  Once,   R     09/10/18 2004          Vitals/Pain Today's Vitals   09/10/18 1820 09/10/18 1821 09/10/18 1900 09/10/18 2000  BP:  (!) 97/59 110/68 112/69  Pulse: 71 63 87 99  Resp:  14 16 16   Temp:      TempSrc:      SpO2: 97% 97% 100% 96%  Weight:      Height:      PainSc:        Isolation Precautions No active isolations  Medications Medications  iopamidol (ISOVUE-300) 61 % injection (has no administration in time range)  sodium chloride (PF) 0.9 % injection (has no administration in time range)  ondansetron (ZOFRAN) injection 4 mg (has no administration in time range)  morphine 2 MG/ML injection 1 mg (has no administration in time range)  sodium chloride 0.9 % bolus 1,000 mL (0 mLs Intravenous Stopped 09/10/18 1933)  iopamidol (ISOVUE-300) 61 % injection 100 mL (100 mLs Intravenous Contrast Given 09/10/18 1735)  sodium chloride 0.9 % bolus 1,000 mL (1,000 mLs Intravenous Transfusing/Transfer 09/10/18 2058)    Mobility Walks however is weak. Recommend not walking at this time.

## 2018-09-11 DIAGNOSIS — K625 Hemorrhage of anus and rectum: Principal | ICD-10-CM

## 2018-09-11 LAB — CBC
HCT: 32.5 % — ABNORMAL LOW (ref 39.0–52.0)
HEMATOCRIT: 31.6 % — AB (ref 39.0–52.0)
HEMOGLOBIN: 10.6 g/dL — AB (ref 13.0–17.0)
Hemoglobin: 11.2 g/dL — ABNORMAL LOW (ref 13.0–17.0)
MCH: 29.6 pg (ref 26.0–34.0)
MCH: 29 pg (ref 26.0–34.0)
MCHC: 34.5 g/dL (ref 30.0–36.0)
MCHC: 33.5 g/dL (ref 30.0–36.0)
MCV: 85.8 fL (ref 80.0–100.0)
MCV: 86.6 fL (ref 80.0–100.0)
Platelets: 183 10*3/uL (ref 150–400)
Platelets: 197 10*3/uL (ref 150–400)
RBC: 3.79 MIL/uL — ABNORMAL LOW (ref 4.22–5.81)
RBC: 3.65 MIL/uL — ABNORMAL LOW (ref 4.22–5.81)
RDW: 13 % (ref 11.5–15.5)
RDW: 13 % (ref 11.5–15.5)
WBC: 12.1 10*3/uL — AB (ref 4.0–10.5)
WBC: 9.4 10*3/uL (ref 4.0–10.5)
nRBC: 0 % (ref 0.0–0.2)
nRBC: 0 % (ref 0.0–0.2)

## 2018-09-11 LAB — HIV ANTIBODY (ROUTINE TESTING W REFLEX): HIV Screen 4th Generation wRfx: NONREACTIVE

## 2018-09-11 LAB — BASIC METABOLIC PANEL
Anion gap: 5 (ref 5–15)
BUN: 13 mg/dL (ref 6–20)
CHLORIDE: 106 mmol/L (ref 98–111)
CO2: 25 mmol/L (ref 22–32)
Calcium: 8 mg/dL — ABNORMAL LOW (ref 8.9–10.3)
Creatinine, Ser: 0.77 mg/dL (ref 0.61–1.24)
GFR calc non Af Amer: >60 mL/min (ref >60)
Glucose, Bld: 107 mg/dL — ABNORMAL HIGH (ref 70–99)
Potassium: 3.7 mmol/L (ref 3.5–5.1)
Sodium: 136 mmol/L (ref 135–145)

## 2018-09-11 LAB — URINALYSIS, ROUTINE W REFLEX MICROSCOPIC
Bilirubin Urine: NEGATIVE
GLUCOSE, UA: NEGATIVE mg/dL
HGB URINE DIPSTICK: NEGATIVE
Ketones, ur: NEGATIVE mg/dL
Leukocytes, UA: NEGATIVE
Nitrite: NEGATIVE
PROTEIN: NEGATIVE mg/dL
Specific Gravity, Urine: 1.017 (ref 1.005–1.030)
pH: 5 (ref 5.0–8.0)

## 2018-09-11 MED ORDER — SODIUM CHLORIDE 0.9 % IV SOLN
INTRAVENOUS | Status: DC
  Administered 2018-09-11 – 2018-09-12 (×4): via INTRAVENOUS

## 2018-09-11 MED ORDER — PEG 3350-KCL-NA BICARB-NACL 420 G PO SOLR
4000.0000 mL | Freq: Once | ORAL | Status: AC
  Administered 2018-09-11: 4000 mL via ORAL

## 2018-09-11 MED ORDER — BISACODYL 5 MG PO TBEC
10.0000 mg | DELAYED_RELEASE_TABLET | Freq: Once | ORAL | Status: AC
  Administered 2018-09-11: 10 mg via ORAL
  Filled 2018-09-11: qty 2

## 2018-09-11 NOTE — H&P (View-Only) (Signed)
[facility] Reason for consult: Rectal bleeding Referring Physician: [facility] hospitalist.  PCP: Dr. Ralene Ok at [facility].  Primary GI: Dr Yevonne Pax at [facility] in [place] is an 23 y.o. male.  HPI: He has a history of STDs.  He has had a couple of sigmoidoscopies in the past but no colonoscopy.  Approximately 3 weeks ago he underwent sigmoidoscopy with banding of internal hemorrhoids.  He reports that the band came off a couple days ago.  He felt some cramping and then passed a large quantity of bright red blood.  He reports this has been clearing up.  He has been taking ibuprofen and some other medication given to him by a friend for abdominal pain.  He frequently has cramping abdominal pains and is constipated and has to strain to pass his stool.  To the best of his knowledge she has no family history of colon CA or IBD.  He was observed while in the ER to have a bowel movement with dark clots and BRB and had a syncopal event.  His hemoglobin since admission has dropped 4 g from 15.6-11.2.  He notes that he still having vague cramping abdominal pains.  Past Medical History:  Diagnosis Date  . Gonorrhea    resolved (2019)    History reviewed. No pertinent surgical history.  History reviewed. No pertinent family history.  Social History:  reports that he has never smoked. He has never used smokeless tobacco. He reports that he does not drink alcohol or use drugs.  Allergies:  Allergies  Allergen Reactions  . Oxycodone Nausea Only and Other (See Comments)    ABD cramping    Medications; Prior to Admission medications   Medication Sig Start Date End Date Taking? Authorizing Provider  acetaminophen (TYLENOL) 500 MG tablet Take 500 mg by mouth every 6 (six) hours as needed for mild pain or moderate pain.   Yes [provider]    PRN Meds morphine injection, ondansetron (ZOFRAN) IV Results for orders placed or  performed during the hospital encounter of 09/10/18 (from the past 48 hour(s))  Comprehensive metabolic panel     Status: Abnormal   Collection Time: 09/10/18  3:39 PM  Result Value Ref Range   Sodium 137 135 - 145 mmol/L   Potassium 4.0 3.5 - 5.1 mmol/L   Chloride 104 98 - 111 mmol/L   CO2 25 22 - 32 mmol/L   Glucose, Bld 94 70 - 99 mg/dL   BUN 14 6 - 20 mg/dL   Creatinine, Ser 3.66 0.61 - 1.24 mg/dL   Calcium 8.6 (L) 8.9 - 10.3 mg/dL   Total Protein 6.9 6.5 - 8.1 g/dL   Albumin 4.1 3.5 - 5.0 g/dL   AST 19 15 - 41 U/L   ALT 21 0 - 44 U/L   Alkaline Phosphatase 72 38 - 126 U/L   Total Bilirubin 0.8 0.3 - 1.2 mg/dL   GFR calc non Af Amer >60 >60 mL/min   GFR calc Af Amer >60 >60 mL/min   Anion gap 8 5 - 15    Comment: Performed at [facility], [address], [place], [place] [address]  CBC     Status: None   Collection Time: 09/10/18  3:39 PM  Result Value Ref Range   WBC 10.4 4.0 - 10.5 K/uL   RBC 5.37 4.22 - 5.81 MIL/uL   Hemoglobin 15.6 13.0 - 17.0 g/dL   HCT 74.2 59.5 - 63.8 %  MCV 85.8 80.0 - 100.0 fL   MCH 29.1 26.0 - 34.0 pg   MCHC 33.8 30.0 - 36.0 g/dL   RDW 16.113.0 09.611.5 - 04.515.5 %   Platelets 234 150 - 400 K/uL   nRBC 0.0 0.0 - 0.2 %    Comment: Performed at [facility], [address] [place], [place] [address]  Type and screen [facility]     Status: None   Collection Time: 09/10/18  3:39 PM  Result Value Ref Range   ABO/RH(D) O POS    Antibody Screen NEG    Sample Expiration      09/13/2018 Performed at [facility], [address] [place], [place] [address]   ABO/Rh     Status: None   Collection Time: 09/10/18  4:00 PM  Result Value Ref Range   ABO/RH(D)      O POS Performed at [facility], [address] [place], [place] [address]   Protime-INR     Status: None   Collection Time: 09/10/18  4:32 PM  Result Value Ref Range   Prothrombin Time 13.5 11.4 -  15.2 seconds   INR 1.04     Comment: Performed at [facility], [address] [place], [place] [address]  POC occult blood, ED     Status: Abnormal   Collection Time: 09/10/18  6:33 PM  Result Value Ref Range   Fecal Occult Bld POSITIVE (A) NEGATIVE  Hemoglobin and hematocrit, blood     Status: Abnormal   Collection Time: 09/10/18  7:18 PM  Result Value Ref Range   Hemoglobin 12.2 (L) 13.0 - 17.0 g/dL   HCT 65.735.6 (L) 84.639.0 - 96.252.0 %    Comment: Performed at [facility], [address] [place], [place] [address]  HIV antibody (Routine Testing)     Status: None   Collection Time: 09/11/18  3:52 AM  Result Value Ref Range   HIV Screen 4th Generation wRfx Non Reactive Non Reactive    Comment: (NOTE) Performed At: [facility] [address] [place], [place] [id number] Jolene SchimkeNagendra Sanjai MD VO:[phone]   CBC     Status: Abnormal   Collection Time: 09/11/18  3:52 AM  Result Value Ref Range   WBC 12.1 (H) 4.0 - 10.5 K/uL   RBC 3.79 (L) 4.22 - 5.81 MIL/uL   Hemoglobin 11.2 (L) 13.0 - 17.0 g/dL   HCT 42.532.5 (L) 95.639.0 - 38.752.0 %   MCV 85.8 80.0 - 100.0 fL   MCH 29.6 26.0 - 34.0 pg   MCHC 34.5 30.0 - 36.0 g/dL   RDW 56.413.0 33.211.5 - 95.115.5 %   Platelets 183 150 - 400 K/uL   nRBC 0.0 0.0 - 0.2 %    Comment: Performed at [facility], [address] [place], [place] [address]  Basic metabolic panel     Status: Abnormal   Collection Time: 09/11/18  3:52 AM  Result Value Ref Range   Sodium 136 135 - 145 mmol/L   Potassium 3.7 3.5 - 5.1 mmol/L   Chloride 106 98 - 111 mmol/L   CO2 25 22 - 32 mmol/L   Glucose, Bld 107 (H) 70 - 99 mg/dL   BUN 13 6 - 20 mg/dL   Creatinine, Ser 6.060.77 0.61 - 1.24 mg/dL   Calcium 8.0 (L) 8.9 - 10.3 mg/dL   GFR calc non Af Amer >60 >60 mL/min   GFR calc Af Amer >  60 >60 mL/min   Anion gap 5 5 - 15    Comment: Performed at [facility], [address] [place], [place] [address]   Urinalysis, Routine w reflex microscopic     Status: None   Collection Time: 09/11/18  9:48 AM  Result Value Ref Range   Color, Urine YELLOW YELLOW   APPearance CLEAR CLEAR   Specific Gravity, Urine 1.017 1.005 - 1.030   pH 5.0 5.0 - 8.0   Glucose, UA NEGATIVE NEGATIVE mg/dL   Hgb urine dipstick NEGATIVE NEGATIVE   Bilirubin Urine NEGATIVE NEGATIVE   Ketones, ur NEGATIVE NEGATIVE mg/dL   Protein, ur NEGATIVE NEGATIVE mg/dL   Nitrite NEGATIVE NEGATIVE   Leukocytes, UA NEGATIVE NEGATIVE    Comment: Performed at [facility], [address], [place], [place] [address]    Ct Abdomen Pelvis W Contrast  Result Date: 09/10/2018 CLINICAL DATA:  Rectal bleeding. Right lower quadrant abdominal pain throughout the day with rectal bleeding 15 minutes ago. EXAM: CT ABDOMEN AND PELVIS WITH CONTRAST TECHNIQUE: Multidetector CT imaging of the abdomen and pelvis was performed using the standard protocol following bolus administration of intravenous contrast. CONTRAST:  ISOVUE-300 IOPAMIDOL (ISOVUE-300) INJECTION 61% COMPARISON:  None FINDINGS: Lower chest: No acute abnormality. Hepatobiliary: There is a geographic region of low attenuation in the left hepatic lobe on series 2, image 20 and coronal image 33, likely either focal fatty deposition or a perfusion variant. The finding does not appear particularly masslike. Hepatic steatosis. The liver is otherwise normal. The gallbladder is unremarkable. The portal vein is patent. Pancreas: Unremarkable. No pancreatic ductal dilatation or surrounding inflammatory changes. Spleen: Normal in size without focal abnormality. Adrenals/Urinary Tract: Adrenal glands are normal. There is a cyst in the lower right kidney with an attenuation of 14 Hounsfield units. No suspicious masses, stones, hydronephrosis, or perinephric stranding. No ureterectasis or ureteral stone. The bladder is normal. Stomach/Bowel: The stomach and small bowel are normal. There  is high attenuation material in the rectum, likely active bleeding given history. The remainder of the colon is normal. The appendix is well seen with no evidence of appendicitis. Vascular/Lymphatic: No significant vascular findings are present. No enlarged abdominal or pelvic lymph nodes. Reproductive: Prostate is unremarkable. Other: No abdominal wall hernia or abnormality. No abdominopelvic ascites. Musculoskeletal: No acute or significant osseous findings. IMPRESSION: 1. High attenuation in the rectum is consistent with active bleeding from the rectum given history. No underlying cause for the bleeding is identified. 2. No other acute abnormalities. Electronically Signed   By: Gerome Sam III M.D   On: 09/10/2018 18:14   ROS: Constitutional: Patient reports that he has lost approximately 100 pounds over the past couple years.  He has been trying to lose weight and reports that he was up to 300 pounds at one time. HEENT: Negative Cardiovascular: Negative Respiratory: Negative GI: Chronic cramping abdominal pain that is been going on for approximately a year. GU: Negative Musculoskeletal: Negative Neuro/Psychiatric: Negative Endocrine/Heme: Negative            Blood pressure (!) 101/56, pulse 79, temperature 98 F (36.7 C), temperature source Oral, resp. rate 16, height 6' (1.829 m), weight 83.9 kg, SpO2 100 %.  Physical exam:   General--Pleasant well-developed African American male appears comfortable ENT--nonicteric mucous membranes are moist Neck--supple Heart--regular rate and rhythm without murmurs or gallops Lungs-clear- Abdomen--soft and nontender Psych--alert and oriented answers questions appropriately.   Assessment: 1.  Hematochezia.  The patient had hemorrhoids  banded several weeks ago and I suspect that the area of banding may just have bled. 2.  Chronic abdominal pain and worsening constipation.  This could all be IBS but given his bleeding and weight loss  etc. I think we should go ahead and evaluate his colon.  Plan: We will begin the patient on clear liquids and plan on going ahead with colonoscopy tomorrow.  This will be scheduled about 12 noon.  The procedure including the risk and benefits and the possible need for biopsy etc. has been discussed with the patient.   Tresea MallJames L Jamoni Hewes 09/11/2018, 2:33 PM   This note was created using voice recognition software and minor errors may Have occurred unintentionally. Pager: [phone] If no answer or after hours call [phone]

## 2018-09-11 NOTE — Consult Note (Signed)
[facility] CONSULT Reason for consult: Rectal bleeding Referring Physician: [facility].  PCP: Dr. Sarah Gordon at [facility].  Primary GI: [facility]opes [facility]   Chad Bolton is an 22 y.o. male.  HPI: He has a history of STDs.  He has had a couple of sigmoidoscopies in the past but no colonoscopy.  Approximately 3 weeks ago he underwent sigmoidoscopy with banding of internal hemorrhoids.  He reports that the band came off a couple days ago.  He felt some cramping and then passed a large quantity of bright red blood.  He reports this has been clearing up.  He has been taking ibuprofen and some other medication given to him by a friend for abdominal pain.  He frequently has cramping abdominal pains and is constipated and has to strain to pass his stool.  To the best of his knowledge she has no family history of colon CA or IBD.  He was observed while in the ER to have a bowel movement with dark clots and BRB and had a syncopal event.  His hemoglobin since admission has dropped 4 g from 15.6-11.2.  He notes that he still having vague cramping abdominal pains.  Past Medical History:  Diagnosis Date  . Gonorrhea    resolved (2019)    History reviewed. No pertinent surgical history.  History reviewed. No pertinent family history.  Social History:  reports that he has never smoked. He has never used smokeless tobacco. He reports that he does not drink alcohol or use drugs.  Allergies:  Allergies  Allergen Reactions  . Oxycodone Nausea Only and Other (See Comments)    ABD cramping    Medications; Prior to Admission medications   Medication Sig Start Date End Date Taking? Authorizing Provider  acetaminophen (TYLENOL) 500 MG tablet Take 500 mg by mouth every 6 (six) hours as needed for mild pain or moderate pain.   Yes [provider]    PRN Meds morphine injection, ondansetron (ZOFRAN) IV Results for orders placed or  performed during the hospital encounter of 09/10/18 (from the past 48 hour(s))  Comprehensive metabolic panel     Status: Abnormal   Collection Time: 09/10/18  3:39 PM  Result Value Ref Range   Sodium 137 135 - 145 mmol/L   Potassium 4.0 3.5 - 5.1 mmol/L   Chloride 104 98 - 111 mmol/L   CO2 25 22 - 32 mmol/L   Glucose, Bld 94 70 - 99 mg/dL   BUN 14 6 - 20 mg/dL   Creatinine, Ser 0.90 0.61 - 1.24 mg/dL   Calcium 8.6 (L) 8.9 - 10.3 mg/dL   Total Protein 6.9 6.5 - 8.1 g/dL   Albumin 4.1 3.5 - 5.0 g/dL   AST 19 15 - 41 U/L   ALT 21 0 - 44 U/L   Alkaline Phosphatase 72 38 - 126 U/L   Total Bilirubin 0.8 0.3 - 1.2 mg/dL   GFR calc non Af Amer >60 >60 mL/min   GFR calc Af Amer >60 >60 mL/min   Anion gap 8 5 - 15    Comment: Performed at [facility], [address] [place], [place] [address]  CBC     Status: None   Collection Time: 09/10/18  3:39 PM  Result Value Ref Range   WBC 10.4 4.0 - 10.5 K/uL   RBC 5.37 4.22 - 5.81 MIL/uL   Hemoglobin 15.6 13.0 - 17.0 g/dL   HCT 46.1 39.0 - 52.0 %     MCV 85.8 80.0 - 100.0 fL   MCH 29.1 26.0 - 34.0 pg   MCHC 33.8 30.0 - 36.0 g/dL   RDW 13.0 11.5 - 15.5 %   Platelets 234 150 - 400 K/uL   nRBC 0.0 0.0 - 0.2 %    Comment: Performed at [facility], [address] [place], [place] [address]  Type and screen [facility]     Status: None   Collection Time: 09/10/18  3:39 PM  Result Value Ref Range   ABO/RH(D) O POS    Antibody Screen NEG    Sample Expiration      09/13/2018 Performed at [facility], [address] [place], [place] [address]   ABO/Rh     Status: None   Collection Time: 09/10/18  4:00 PM  Result Value Ref Range   ABO/RH(D)      O POS Performed at [facility], [address] [place], [place] [address]   Protime-INR     Status: None   Collection Time: 09/10/18  4:32 PM  Result Value Ref Range   Prothrombin Time 13.5 11.4 -  15.2 seconds   INR 1.04     Comment: Performed at [facility], [address] [place], [place] [address]  POC occult blood, ED     Status: Abnormal   Collection Time: 09/10/18  6:33 PM  Result Value Ref Range   Fecal Occult Bld POSITIVE (A) NEGATIVE  Hemoglobin and hematocrit, blood     Status: Abnormal   Collection Time: 09/10/18  7:18 PM  Result Value Ref Range   Hemoglobin 12.2 (L) 13.0 - 17.0 g/dL   HCT 35.6 (L) 39.0 - 52.0 %    Comment: Performed at [facility], [address] [place], [place] [address]  HIV antibody (Routine Testing)     Status: None   Collection Time: 09/11/18  3:52 AM  Result Value Ref Range   HIV Screen 4th Generation wRfx Non Reactive Non Reactive    Comment: (NOTE) Performed At: [facility] [address] [place], [place] [id number] Nagendra Sanjai MD Ph:[phone]   CBC     Status: Abnormal   Collection Time: 09/11/18  3:52 AM  Result Value Ref Range   WBC 12.1 (H) 4.0 - 10.5 K/uL   RBC 3.79 (L) 4.22 - 5.81 MIL/uL   Hemoglobin 11.2 (L) 13.0 - 17.0 g/dL   HCT 32.5 (L) 39.0 - 52.0 %   MCV 85.8 80.0 - 100.0 fL   MCH 29.6 26.0 - 34.0 pg   MCHC 34.5 30.0 - 36.0 g/dL   RDW 13.0 11.5 - 15.5 %   Platelets 183 150 - 400 K/uL   nRBC 0.0 0.0 - 0.2 %    Comment: Performed at [facility], [address] [place],  [address]  Basic metabolic panel     Status: Abnormal   Collection Time: 09/11/18  3:52 AM  Result Value Ref Range   Sodium 136 135 - 145 mmol/L   Potassium 3.7 3.5 - 5.1 mmol/L   Chloride 106 98 - 111 mmol/L   CO2 25 22 - 32 mmol/L   Glucose, Bld 107 (H) 70 - 99 mg/dL   BUN 13 6 - 20 mg/dL   Creatinine, Ser 0.77 0.61 - 1.24 mg/dL   Calcium 8.0 (L) 8.9 - 10.3 mg/dL   GFR calc non Af Amer >60 >60 mL/min   GFR calc Af Amer >  60 >60 mL/min   Anion gap 5 5 - 15    Comment: Performed at [facility], [address] [place], [place] [address]   Urinalysis, Routine w reflex microscopic     Status: None   Collection Time: 09/11/18  9:48 AM  Result Value Ref Range   Color, Urine YELLOW YELLOW   APPearance CLEAR CLEAR   Specific Gravity, Urine 1.017 1.005 - 1.030   pH 5.0 5.0 - 8.0   Glucose, UA NEGATIVE NEGATIVE mg/dL   Hgb urine dipstick NEGATIVE NEGATIVE   Bilirubin Urine NEGATIVE NEGATIVE   Ketones, ur NEGATIVE NEGATIVE mg/dL   Protein, ur NEGATIVE NEGATIVE mg/dL   Nitrite NEGATIVE NEGATIVE   Leukocytes, UA NEGATIVE NEGATIVE    Comment: Performed at [facility], [address] [place], [place] [address]    Ct Abdomen Pelvis W Contrast  Result Date: 09/10/2018 CLINICAL DATA:  Rectal bleeding. Right lower quadrant abdominal pain throughout the day with rectal bleeding 15 minutes ago. EXAM: CT ABDOMEN AND PELVIS WITH CONTRAST TECHNIQUE: Multidetector CT imaging of the abdomen and pelvis was performed using the standard protocol following bolus administration of intravenous contrast. CONTRAST:  100mL ISOVUE-300 IOPAMIDOL (ISOVUE-300) INJECTION 61% COMPARISON:  None FINDINGS: Lower chest: No acute abnormality. Hepatobiliary: There is a geographic region of low attenuation in the left hepatic lobe on series 2, image 20 and coronal image 33, likely either focal fatty deposition or a perfusion variant. The finding does not appear particularly masslike. Hepatic steatosis. The liver is otherwise normal. The gallbladder is unremarkable. The portal vein is patent. Pancreas: Unremarkable. No pancreatic ductal dilatation or surrounding inflammatory changes. Spleen: Normal in size without focal abnormality. Adrenals/Urinary Tract: Adrenal glands are normal. There is a cyst in the lower right kidney with an attenuation of 14 Hounsfield units. No suspicious masses, stones, hydronephrosis, or perinephric stranding. No ureterectasis or ureteral stone. The bladder is normal. Stomach/Bowel: The stomach and small bowel are normal. There  is high attenuation material in the rectum, likely active bleeding given history. The remainder of the colon is normal. The appendix is well seen with no evidence of appendicitis. Vascular/Lymphatic: No significant vascular findings are present. No enlarged abdominal or pelvic lymph nodes. Reproductive: Prostate is unremarkable. Other: No abdominal wall hernia or abnormality. No abdominopelvic ascites. Musculoskeletal: No acute or significant osseous findings. IMPRESSION: 1. High attenuation in the rectum is consistent with active bleeding from the rectum given history. No underlying cause for the bleeding is identified. 2. No other acute abnormalities. Electronically Signed   By: David  Williams III M.D   On: 09/10/2018 18:14   ROS: Constitutional: Patient reports that he has lost approximately 100 pounds over the past couple years.  He has been trying to lose weight and reports that he was up to 300 pounds at one time. HEENT: Negative Cardiovascular: Negative Respiratory: Negative GI: Chronic cramping abdominal pain that is been going on for approximately a year. GU: Negative Musculoskeletal: Negative Neuro/Psychiatric: Negative Endocrine/Heme: Negative            Blood pressure (!) 101/56, pulse 79, temperature 98 F (36.7 C), temperature source Oral, resp. rate 16, height 6' (1.829 m), weight 83.9 kg, SpO2 100 %.  Physical exam:   General--Pleasant well-developed African American male appears comfortable ENT--nonicteric mucous membranes are moist Neck--supple Heart--regular rate and rhythm without murmurs or gallops Lungs-clear- Abdomen--soft and nontender Psych--alert and oriented answers questions appropriately.   Assessment: 1.  Hematochezia.  The patient had hemorrhoids   banded several weeks ago and I suspect that the area of banding may just have bled. 2.  Chronic abdominal pain and worsening constipation.  This could all be IBS but given his bleeding and weight loss  etc. I think we should go ahead and evaluate his colon.  Plan: We will begin the patient on clear liquids and plan on going ahead with colonoscopy tomorrow.  This will be scheduled about 12 noon.  The procedure including the risk and benefits and the possible need for biopsy etc. has been discussed with the patient.   Corneisha Alvi L Beyonca Wisz 09/11/2018, 2:33 PM   This note was created using voice recognition software and minor errors may Have occurred unintentionally. Pager: [phone] If no answer or after hours call [phone]    

## 2018-09-11 NOTE — Progress Notes (Signed)
[facility] PROGRESS NOTE  Chad Bolton ZOX:[id number] DOB: 1996-02-12 DOA: 09/10/2018 PCP: System, Provider Not In   Narrative: 23 year old African-American male Known homosexual with anal receptive intercourse Prior gonorrhea Known history of bipolar Seen at [facility] since 12/2017 and then 03/2018 has had slight rectal bleeding clear green mucus after bowel movements and was diagnosed with rectal gonorrhea with a negative test of cure-he was referred to gastroenterology Dr. Carney Bernharles Katopes--eventually had on 06/19/2018 the flex sig which shows subtle nodularity and erythema He tells me 2 weeks prior to coming into the hospital he had banding by his gastroenterologist as well  [facility] with several dark red blood clots and bright red blood on wiping on toilet paper  Because of his bleeding he was admitted to the hospital on 1/26    A & Plan Rectal bleeding Have consulted with gastroenterology Dr. Randa EvensEdwards who agrees that patient may warrant scope-patient will be managed as per their protocols-I see low yield and were repeated.  There STD testing at this stage-he needs to be counseled appropriately by his primary care physician High risk sexual behavior, MSM Appropriate counseling as above follow repeat HIV and urine gonorrhea ordered on admission  Lovenox, full code, no family, inpatient probably needs 1 more midnight to be reviewed by GI and procedure such as a scope to be done  Mahala MenghiniSamtani, MD  [facility] [employer] app OR [email or web address] 7PM-7AM contact night coverage as above 09/11/2018, 2:23 PM  LOS: 1 day     Interval history/Subjective: Doing well no new issues No dark stool no tarry stool Tolerating diet  Objective:  Vitals:  Vitals:   09/10/18 2138 09/11/18 0535  BP: 109/69 (!) 101/56  Pulse: 99 79  Resp: 18 16  Temp: 98.7 F (37.1 C) 98 F (36.7 C)  SpO2: 99% 100%    Exam:  Awake alert pleasant no distress EOMI  NCAT Chest clear S1-S2 no murmur rub or gallop Abdomen soft nontender Rectal exam shows a fissure at 6 PM with probably a thrombosed hemorrhoid at 12 PM I did not perform a rectal exam   I have personally reviewed the following:  DATA   Labs:  Hemoglobin dropped from admission to 11.2 white count is 12.1 glucose is 107  Imaging studies:  None   Scheduled Meds: Continuous Infusions: . sodium chloride 50 mL/hr at 09/11/18 0420    Active Problems:   GI bleed   LOS: 1 day

## 2018-09-12 ENCOUNTER — Encounter (HOSPITAL_COMMUNITY)
Admission: EM | Disposition: A | Discharge status: Home / Self Care | Payer: Self-pay | Source: Home / Self Care | Attending: Family Medicine

## 2018-09-12 ENCOUNTER — Inpatient Hospital Stay (HOSPITAL_COMMUNITY): Payer: BLUE CROSS/BLUE SHIELD | Admitting: Certified Registered"

## 2018-09-12 ENCOUNTER — Encounter (HOSPITAL_COMMUNITY): Payer: Self-pay | Admitting: *Deleted

## 2018-09-12 DIAGNOSIS — K626 Ulcer of anus and rectum: Secondary | ICD-10-CM

## 2018-09-12 HISTORY — PX: COLONOSCOPY WITH PROPOFOL: SHX5780

## 2018-09-12 LAB — CBC WITH DIFFERENTIAL/PLATELET
Abs Immature Granulocytes: 0.05 10*3/uL (ref 0.00–0.07)
Basophils Absolute: 0 10*3/uL (ref 0.0–0.1)
Basophils Relative: 1 %
Eosinophils Absolute: 0.1 10*3/uL (ref 0.0–0.5)
Eosinophils Relative: 1 %
HCT: 29.6 % — ABNORMAL LOW (ref 39.0–52.0)
Hemoglobin: 10 g/dL — ABNORMAL LOW (ref 13.0–17.0)
Immature Granulocytes: 1 %
Lymphocytes Relative: 30 %
Lymphs Abs: 2.6 10*3/uL (ref 0.7–4.0)
MCH: 29.5 pg (ref 26.0–34.0)
MCHC: 33.8 g/dL (ref 30.0–36.0)
MCV: 87.3 fL (ref 80.0–100.0)
MONO ABS: 0.8 10*3/uL (ref 0.1–1.0)
MONOS PCT: 9 %
Neutro Abs: 4.9 10*3/uL (ref 1.7–7.7)
Neutrophils Relative %: 58 %
Platelets: 164 10*3/uL (ref 150–400)
RBC: 3.39 MIL/uL — ABNORMAL LOW (ref 4.22–5.81)
RDW: 13 % (ref 11.5–15.5)
WBC: 8.4 10*3/uL (ref 4.0–10.5)
nRBC: 0 % (ref 0.0–0.2)

## 2018-09-12 LAB — BASIC METABOLIC PANEL
Anion gap: 6 (ref 5–15)
BUN: 7 mg/dL (ref 6–20)
CO2: 26 mmol/L (ref 22–32)
Calcium: 8 mg/dL — ABNORMAL LOW (ref 8.9–10.3)
Chloride: 104 mmol/L (ref 98–111)
Creatinine, Ser: 0.89 mg/dL (ref 0.61–1.24)
GFR calc Af Amer: >60 mL/min (ref >60)
GLUCOSE: 100 mg/dL — AB (ref 70–99)
Potassium: 3.5 mmol/L (ref 3.5–5.1)
Sodium: 136 mmol/L (ref 135–145)

## 2018-09-12 SURGERY — COLONOSCOPY WITH PROPOFOL
Anesthesia: Monitor Anesthesia Care

## 2018-09-12 MED ORDER — LACTATED RINGERS IV SOLN
INTRAVENOUS | Status: DC
  Administered 2018-09-12: 10:00:00 via INTRAVENOUS

## 2018-09-12 MED ORDER — PROPOFOL 10 MG/ML IV BOLUS
INTRAVENOUS | Status: AC
  Filled 2018-09-12: qty 60

## 2018-09-12 MED ORDER — PROPOFOL 10 MG/ML IV BOLUS
INTRAVENOUS | Status: AC
  Filled 2018-09-12: qty 20

## 2018-09-12 MED ORDER — PHENYLEPHRINE 40 MCG/ML (10ML) SYRINGE FOR IV PUSH (FOR BLOOD PRESSURE SUPPORT)
PREFILLED_SYRINGE | INTRAVENOUS | Status: DC | PRN
  Administered 2018-09-12 (×5): 40 ug via INTRAVENOUS
  Administered 2018-09-12: 80 ug via INTRAVENOUS

## 2018-09-12 MED ORDER — SODIUM CHLORIDE 0.9 % IV SOLN: INTRAVENOUS | Status: DC

## 2018-09-12 MED ORDER — PROPOFOL 10 MG/ML IV BOLUS
INTRAVENOUS | Status: DC | PRN
  Administered 2018-09-12 (×7): 20 mg via INTRAVENOUS

## 2018-09-12 MED ORDER — PROPOFOL 500 MG/50ML IV EMUL
INTRAVENOUS | Status: DC | PRN
  Administered 2018-09-12: 125 ug/kg/min via INTRAVENOUS

## 2018-09-12 SURGICAL SUPPLY — 22 items
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
## 2018-09-12 NOTE — Anesthesia Postprocedure Evaluation (Signed)
Anesthesia Post Note  Patient: Chad Bolton  Procedure(s) Performed: COLONOSCOPY WITH PROPOFOL (N/A )     Patient location during evaluation: PACU Anesthesia Type: MAC Level of consciousness: awake and alert Pain management: pain level controlled Vital Signs Assessment: post-procedure vital signs reviewed and stable Respiratory status: spontaneous breathing, nonlabored ventilation, respiratory function stable and patient connected to nasal cannula oxygen Cardiovascular status: stable and blood pressure returned to baseline Postop Assessment: no apparent nausea or vomiting Anesthetic complications: no    Last Vitals:  Vitals:   09/12/18 1210 09/12/18 1215  BP: (!) 98/48 (!) 100/48  Pulse: 84 89  Resp: 20 (!) 21  Temp:    SpO2: 100% 100%    Last Pain:  Vitals:   09/12/18 1215  TempSrc:   PainSc: 0-No pain                 Ryan P Ellender

## 2018-09-12 NOTE — Interval H&P Note (Signed)
History and Physical Interval Note:  09/12/2018 11:07 AM  Chad Bolton  has presented today for surgery, with the diagnosis of GI bleed  The various methods of treatment have been discussed with the patient and family. After consideration of risks, benefits and other options for treatment, the patient has consented to  Procedure(s): COLONOSCOPY WITH PROPOFOL (N/A) as a surgical intervention .  The patient's history has been reviewed, patient examined, no change in status, stable for surgery.  I have reviewed the patient's chart and labs.  Questions were answered to the patient's satisfaction.     [name]

## 2018-09-12 NOTE — Transfer of Care (Signed)
Immediate Anesthesia Transfer of Care Note  Patient: Chad Bolton  Procedure(s) Performed: COLONOSCOPY WITH PROPOFOL (N/A )  Patient Location: PACU and Endoscopy Unit  Anesthesia Type:MAC  Level of Consciousness: awake, alert  and oriented  Airway & Oxygen Therapy: Patient Spontanous Breathing and Patient connected to face mask oxygen  Post-op Assessment: Report given to RN and Post -op Vital signs reviewed and stable  Post vital signs: Reviewed and stable  Last Vitals:  Vitals Value Taken Time  BP 92/30 09/12/2018 12:02 PM  Temp    Pulse 74 09/12/2018 12:03 PM  Resp 21 09/12/2018 12:03 PM  SpO2 100 % 09/12/2018 12:03 PM  Vitals shown include unvalidated device data.  Last Pain:  Vitals:   09/12/18 1202  TempSrc: Oral  PainSc: 0-No pain      Patients Stated Pain Goal: 2 (53/64/68 0321)  Complications: No apparent anesthesia complications

## 2018-09-12 NOTE — Op Note (Signed)
[facility] Patient Name: Chad Bolton Procedure Date: 09/12/2018 MRN: [id number] Attending MD: Nancy Fetter Dr., MD Date of Birth: January 29, 1996 CSN: [id number] Age: 23 Admit Type: Inpatient Procedure:                Colonoscopy Indications:              Hematochezia, patient had hemorrhoidal banding with                            flexible sigmoidoscopy in [place] a few weeks                            ago had some rectal bleeding coming into the                            hospital. He has chronic constipation and abdominal                            pain and is lost a fair amount of weight.                            Procedures done to evaluate for source of bleeding                            and for source of abdominal pain and weight loss. Providers:                Joyice Faster. Emrys Mceachron Dr., MD, Carlyn Reichert, RN, Charolette Child, Technician Referring MD:              Medicines:                Monitored Anesthesia Care Complications:            No immediate complications. Estimated Blood Loss:     Estimated blood loss: none. Procedure:                Pre-Anesthesia Assessment:                           - Prior to the procedure, a History and Physical                            was performed, and patient medications and                            allergies were reviewed. The patient's tolerance of                            previous anesthesia was also reviewed. The risks                            and benefits of the procedure and the sedation  options and risks were discussed with the patient.                            All questions were answered, and informed consent                            was obtained. Prior Anticoagulants: The patient has                            taken no previous anticoagulant or antiplatelet                            agents. ASA Grade Assessment: II - A patient with                mild systemic disease. After reviewing the risks                            and benefits, the patient was deemed in                            satisfactory condition to undergo the procedure.                           After obtaining informed consent, the colonoscope                            was passed under direct vision. Throughout the                            procedure, the patient's blood pressure, pulse, and                            oxygen saturations were monitored continuously. The                            CF-HQ190L ([id number]) Olympus colonoscope was                            introduced through the anus and advanced to the the                            cecum, identified by appendiceal orifice and                            ileocecal valve. The colonoscopy was somewhat                            difficult due to significant looping. Successful                            completion of the procedure was aided by applying                            abdominal pressure. The ileocecal  valve,                            appendiceal orifice, and rectum were photographed.                            The quality of the bowel preparation was fair. The                            cecum contained a fair amount of stool that was                            sticky and adherent. We were unable to enter the                            terminal ileum Scope In: 11:27:27 AM Scope Out: 11:50:12 AM Scope Withdrawal Time: 0 hours 12 minutes 6 seconds  Total Procedure Duration: 0 hours 22 minutes 45 seconds  Findings:      The perianal and digital rectal examinations were normal.      There is no endoscopic evidence of bleeding, diverticula, inflammation,       mass, polyps or ulcerations in the entire colon.      A single (solitary) ulcer was found in the rectum. No bleeding was       present. This was presumably in the area of the prior hemorrhoidal       banding. No other rectal  lesions were seen. Impression:               - Preparation of the colon was fair.                           - A single (solitary) ulcer in the rectum.                           - No specimens collected.                           - Blood found in stool Moderate Sedation:      MAC by anesthesia Recommendation:           - Patient has a contact number available for                            emergencies. The signs and symptoms of potential                            delayed complications were discussed with the                            patient. Return to normal activities tomorrow.                            Written discharge instructions were provided to the                            patient.                           -  Resume previous diet.                           - Continue present medications.                           - Miralax 1 capful (17 grams) in 8 ounces of water                            PO BID.                           - Return to endoscopist in 1 month. Procedure Code(s):        --- Professional ---                           [phone], Colonoscopy, flexible; diagnostic, including                            collection of specimen(s) by brushing or washing,                            when performed (separate procedure) Diagnosis Code(s):        --- Professional ---                           K62.6, Ulcer of anus and rectum                           K92.1, Melena (includes Hematochezia) CPT copyright 2018 American Medical Association. All rights reserved. The codes documented in this report are preliminary and upon coder review may  be revised to meet current compliance requirements. Nancy Fetter Dr., MD 09/12/2018 12:09:06 PM This report has been signed electronically. Number of Addenda: 0

## 2018-09-12 NOTE — Anesthesia Preprocedure Evaluation (Addendum)
Anesthesia Evaluation  Patient identified by MRN, date of birth, ID band Patient awake    Reviewed: Allergy & Precautions, NPO status , Patient's Chart, lab work & pertinent test results  Airway Mallampati: I  TM Distance: >3 FB Neck ROM: Full    Dental no notable dental hx.    Pulmonary neg pulmonary ROS,    Pulmonary exam normal breath sounds clear to auscultation       Cardiovascular negative cardio ROS Normal cardiovascular exam Rhythm:Regular Rate:Normal     Neuro/Psych negative neurological ROS  negative psych ROS   GI/Hepatic negative GI ROS, Neg liver ROS,   Endo/Other  negative endocrine ROS  Renal/GU negative Renal ROS     Musculoskeletal negative musculoskeletal ROS (+)   Abdominal   Peds  Hematology  (+) anemia ,   Anesthesia Other Findings GI bleed  Reproductive/Obstetrics                            Anesthesia Physical Anesthesia Plan  ASA: II  Anesthesia Plan: MAC   Post-op Pain Management:    Induction: Intravenous  PONV Risk Score and Plan: 1 and Propofol infusion and Treatment may vary due to age or medical condition  Airway Management Planned: Nasal Cannula  Additional Equipment:   Intra-op Plan:   Post-operative Plan:   Informed Consent: I have reviewed the patients History and Physical, chart, labs and discussed the procedure including the risks, benefits and alternatives for the proposed anesthesia with the patient or authorized representative who has indicated his/her understanding and acceptance.     Dental advisory given  Plan Discussed with: CRNA  Anesthesia Plan Comments:         Anesthesia Quick Evaluation

## 2018-09-12 NOTE — Progress Notes (Signed)
GI Discharge/Sign-Off Note  Subjective: Colonoscopy negative other than ulceration from hemorrhoidal banding  Active Problems:   GI bleed   Results for orders placed or performed during the hospital encounter of 09/10/18 (from the past 72 hour(s))  Comprehensive metabolic panel     Status: Abnormal   Collection Time: 09/10/18  3:39 PM  Result Value Ref Range   Sodium 137 135 - 145 mmol/L   Potassium 4.0 3.5 - 5.1 mmol/L   Chloride 104 98 - 111 mmol/L   CO2 25 22 - 32 mmol/L   Glucose, Bld 94 70 - 99 mg/dL   BUN 14 6 - 20 mg/dL   Creatinine, Ser 1.610.90 0.61 - 1.24 mg/dL   Calcium 8.6 (L) 8.9 - 10.3 mg/dL   Total Protein 6.9 6.5 - 8.1 g/dL   Albumin 4.1 3.5 - 5.0 g/dL   AST 19 15 - 41 U/L   ALT 21 0 - 44 U/L   Alkaline Phosphatase 72 38 - 126 U/L   Total Bilirubin 0.8 0.3 - 1.2 mg/dL   GFR calc non Af Amer >60 >60 mL/min   GFR calc Af Amer >60 >60 mL/min   Anion gap 8 5 - 15    Comment: Performed at [facility], [address] [place], [place] [address]  CBC     Status: None   Collection Time: 09/10/18  3:39 PM  Result Value Ref Range   WBC 10.4 4.0 - 10.5 K/uL   RBC 5.37 4.22 - 5.81 MIL/uL   Hemoglobin 15.6 13.0 - 17.0 g/dL   HCT 54.046.1 98.139.0 - 19.152.0 %   MCV 85.8 80.0 - 100.0 fL   MCH 29.1 26.0 - 34.0 pg   MCHC 33.8 30.0 - 36.0 g/dL   RDW 47.813.0 29.511.5 - 62.115.5 %   Platelets 234 150 - 400 K/uL   nRBC 0.0 0.0 - 0.2 %    Comment: Performed at [facility], [address] [place], [place] [address]  Type and screen [facility]     Status: None   Collection Time: 09/10/18  3:39 PM  Result Value Ref Range   ABO/RH(D) O POS    Antibody Screen NEG    Sample Expiration      09/13/2018 Performed at [facility], [address] [place], [place] [address]   ABO/Rh     Status: None   Collection Time: 09/10/18  4:00 PM  Result Value Ref Range   ABO/RH(D)      O POS Performed at [facility], [address] [place], [place] [address]   Protime-INR     Status: None   Collection Time: 09/10/18  4:32 PM  Result Value Ref Range   Prothrombin Time 13.5 11.4 - 15.2 seconds   INR 1.04     Comment: Performed at [facility], [address] [place], [place] [address]  POC occult blood, ED     Status: Abnormal   Collection Time: 09/10/18  6:33 PM  Result Value Ref Range   Fecal Occult Bld POSITIVE (A) NEGATIVE  Hemoglobin and hematocrit, blood     Status: Abnormal   Collection Time: 09/10/18  7:18 PM  Result Value Ref Range   Hemoglobin 12.2 (L) 13.0 - 17.0 g/dL   HCT 44.035.6 (L) 10.239.0 - 72.552.0 %    Comment: Performed at [facility], [address] [place], [place] [address]  HIV antibody (Routine Testing)  Status: None   Collection Time: 09/11/18  3:52 AM  Result Value Ref Range   HIV Screen 4th Generation wRfx Non Reactive Non Reactive    Comment: (NOTE) Performed At: [facility] [address] [place], [place] [id number] Jolene SchimkeNagendra Sanjai MD WU:[id number]:[phone]   CBC     Status: Abnormal   Collection Time: 09/11/18  3:52 AM  Result Value Ref Range   WBC 12.1 (H) 4.0 - 10.5 K/uL   RBC 3.79 (L) 4.22 - 5.81 MIL/uL   Hemoglobin 11.2 (L) 13.0 - 17.0 g/dL   HCT 95.632.5 (L) 21.339.0 - 08.652.0 %   MCV 85.8 80.0 - 100.0 fL   MCH 29.6 26.0 - 34.0 pg   MCHC 34.5 30.0 - 36.0 g/dL   RDW 57.813.0 46.911.5 - 62.915.5 %   Platelets 183 150 - 400 K/uL   nRBC 0.0 0.0 - 0.2 %    Comment: Performed at [facility], [address] [place], [place] [address]  Basic metabolic panel     Status: Abnormal   Collection Time: 09/11/18  3:52 AM  Result Value Ref Range   Sodium 136 135 - 145 mmol/L   Potassium 3.7 3.5 - 5.1 mmol/L   Chloride 106 98 - 111 mmol/L   CO2 25 22 - 32 mmol/L   Glucose, Bld 107 (H) 70 - 99 mg/dL   BUN 13 6 - 20 mg/dL   Creatinine, Ser 3.240.77 0.61 - 1.24 mg/dL   Calcium 8.0 (L) 8.9 - 10.3 mg/dL   GFR calc non Af Amer  >60 >60 mL/min   GFR calc Af Amer >60 >60 mL/min   Anion gap 5 5 - 15    Comment: Performed at [facility], [address] [place], [place] [address]  Urinalysis, Routine w reflex microscopic     Status: None   Collection Time: 09/11/18  9:48 AM  Result Value Ref Range   Color, Urine YELLOW YELLOW   APPearance CLEAR CLEAR   Specific Gravity, Urine 1.017 1.005 - 1.030   pH 5.0 5.0 - 8.0   Glucose, UA NEGATIVE NEGATIVE mg/dL   Hgb urine dipstick NEGATIVE NEGATIVE   Bilirubin Urine NEGATIVE NEGATIVE   Ketones, ur NEGATIVE NEGATIVE mg/dL   Protein, ur NEGATIVE NEGATIVE mg/dL   Nitrite NEGATIVE NEGATIVE   Leukocytes, UA NEGATIVE NEGATIVE    Comment: Performed at [facility], [address] [place], [place] [address]  CBC     Status: Abnormal   Collection Time: 09/11/18 10:58 PM  Result Value Ref Range   WBC 9.4 4.0 - 10.5 K/uL   RBC 3.65 (L) 4.22 - 5.81 MIL/uL   Hemoglobin 10.6 (L) 13.0 - 17.0 g/dL   HCT 64.431.6 (L) 03.439.0 - 74.252.0 %   MCV 86.6 80.0 - 100.0 fL   MCH 29.0 26.0 - 34.0 pg   MCHC 33.5 30.0 - 36.0 g/dL   RDW 59.513.0 63.811.5 - 75.615.5 %   Platelets 197 150 - 400 K/uL   nRBC 0.0 0.0 - 0.2 %    Comment: Performed at [facility], [address] [place], [place] [address]  Basic metabolic panel     Status: Abnormal   Collection Time: 09/12/18  4:14 AM  Result Value Ref Range   Sodium 136 135 - 145 mmol/L   Potassium 3.5 3.5 - 5.1 mmol/L   Chloride 104 98 - 111 mmol/L   CO2 26 22 - 32 mmol/L   Glucose, Bld 100 (H)  70 - 99 mg/dL   BUN 7 6 - 20 mg/dL   Creatinine, Ser 1.61 0.61 - 1.24 mg/dL   Calcium 8.0 (L) 8.9 - 10.3 mg/dL   GFR calc non Af Amer >60 >60 mL/min   GFR calc Af Amer >60 >60 mL/min   Anion gap 6 5 - 15    Comment: Performed at [facility] [address], [place], [place] [address]  CBC with Differential/Platelet     Status: Abnormal   Collection Time: 09/12/18  4:14 AM  Result Value Ref  Range   WBC 8.4 4.0 - 10.5 K/uL   RBC 3.39 (L) 4.22 - 5.81 MIL/uL   Hemoglobin 10.0 (L) 13.0 - 17.0 g/dL   HCT 54.0 (L) 98.1 - 19.1 %   MCV 87.3 80.0 - 100.0 fL   MCH 29.5 26.0 - 34.0 pg   MCHC 33.8 30.0 - 36.0 g/dL   RDW 47.8 29.5 - 62.1 %   Platelets 164 150 - 400 K/uL   nRBC 0.0 0.0 - 0.2 %   Neutrophils Relative % 58 %   Neutro Abs 4.9 1.7 - 7.7 K/uL   Lymphocytes Relative 30 %   Lymphs Abs 2.6 0.7 - 4.0 K/uL   Monocytes Relative 9 %   Monocytes Absolute 0.8 0.1 - 1.0 K/uL   Eosinophils Relative 1 %   Eosinophils Absolute 0.1 0.0 - 0.5 K/uL   Basophils Relative 1 %   Basophils Absolute 0.0 0.0 - 0.1 K/uL   Immature Granulocytes 1 %   Abs Immature Granulocytes 0.05 0.00 - 0.07 K/uL    Comment: Performed at [facility], [address] [place], [place] [address]    Ct Abdomen Pelvis W Contrast  Result Date: 09/10/2018 CLINICAL DATA:  Rectal bleeding. Right lower quadrant abdominal pain throughout the day with rectal bleeding 15 minutes ago. EXAM: CT ABDOMEN AND PELVIS WITH CONTRAST TECHNIQUE: Multidetector CT imaging of the abdomen and pelvis was performed using the standard protocol following bolus administration of intravenous contrast. CONTRAST:  ISOVUE-300 IOPAMIDOL (ISOVUE-300) INJECTION 61% COMPARISON:  None FINDINGS: Lower chest: No acute abnormality. Hepatobiliary: There is a geographic region of low attenuation in the left hepatic lobe on series 2, image 20 and coronal image 33, likely either focal fatty deposition or a perfusion variant. The finding does not appear particularly masslike. Hepatic steatosis. The liver is otherwise normal. The gallbladder is unremarkable. The portal vein is patent. Pancreas: Unremarkable. No pancreatic ductal dilatation or surrounding inflammatory changes. Spleen: Normal in size without focal abnormality. Adrenals/Urinary Tract: Adrenal glands are normal. There is a cyst in the lower right kidney with an attenuation of  14 Hounsfield units. No suspicious masses, stones, hydronephrosis, or perinephric stranding. No ureterectasis or ureteral stone. The bladder is normal. Stomach/Bowel: The stomach and small bowel are normal. There is high attenuation material in the rectum, likely active bleeding given history. The remainder of the colon is normal. The appendix is well seen with no evidence of appendicitis. Vascular/Lymphatic: No significant vascular findings are present. No enlarged abdominal or pelvic lymph nodes. Reproductive: Prostate is unremarkable. Other: No abdominal wall hernia or abnormality. No abdominopelvic ascites. Musculoskeletal: No acute or significant osseous findings. IMPRESSION: 1. High attenuation in the rectum is consistent with active bleeding from the rectum given history. No underlying cause for the bleeding is identified. 2. No other acute abnormalities. Electronically Signed   By: Gerome Sam III M.D   On: 09/10/2018 18:14    @[email or web address] @  GI DISCHARGE PLANNING:  Diet: High-fiber diet  Anticoagulation/Antiplatelet Rx Suggestions:   GI Medications: Would send home on MiraLAX twice daily  Labs/Procedures Ordered:  GI FOLLOW UP:  Call [phone] to make appointment.   Doctor: Dr. Randa Evens in 1 month  Time:      Carman Ching, MD   Pager [phone] If no answer or after hours call [phone]

## 2018-09-13 ENCOUNTER — Encounter (HOSPITAL_COMMUNITY): Payer: Self-pay | Admitting: Gastroenterology

## 2018-09-13 DIAGNOSIS — D62 Acute posthemorrhagic anemia: Secondary | ICD-10-CM

## 2018-09-13 DIAGNOSIS — K626 Ulcer of anus and rectum: Secondary | ICD-10-CM | POA: Diagnosis present

## 2018-09-13 LAB — CBC
HCT: 29 % — ABNORMAL LOW (ref 39.0–52.0)
Hemoglobin: 9.9 g/dL — ABNORMAL LOW (ref 13.0–17.0)
MCH: 29.5 pg (ref 26.0–34.0)
MCHC: 34.1 g/dL (ref 30.0–36.0)
MCV: 86.3 fL (ref 80.0–100.0)
Platelets: 176 10*3/uL (ref 150–400)
RBC: 3.36 MIL/uL — ABNORMAL LOW (ref 4.22–5.81)
RDW: 12.9 % (ref 11.5–15.5)
WBC: 8.8 10*3/uL (ref 4.0–10.5)
nRBC: 0 % (ref 0.0–0.2)

## 2018-09-13 MED ORDER — POLYETHYLENE GLYCOL 3350 17 G PO PACK: 17.0000 g | PACK | Freq: Two times a day (BID) | ORAL | 5 refills | Status: DC

## 2018-09-13 MED ORDER — POLYETHYLENE GLYCOL 3350 17 G PO PACK
17.0000 g | PACK | Freq: Two times a day (BID) | ORAL | Status: DC
  Administered 2018-09-13: 17 g via ORAL
  Filled 2018-09-13: qty 1
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
## 2018-09-13 NOTE — Progress Notes (Signed)
Assessment unchanged. Pt verbalized understanding of dc instructions through teach back as given by [employer] nursing  Instructor, Duwayne Heckanielle, RN and student. Script provided by MD. Discharged via foot per request to front entrance accompanied by [employer] student.

## 2018-11-28 ENCOUNTER — Other Ambulatory Visit: Payer: Self-pay

## 2018-11-28 ENCOUNTER — Emergency Department (HOSPITAL_COMMUNITY): Payer: BLUE CROSS/BLUE SHIELD

## 2018-11-28 ENCOUNTER — Emergency Department (HOSPITAL_COMMUNITY)
Admission: EM | Admit: 2018-11-28 | Discharge: 2018-11-28 | Disposition: A | Discharge status: Home / Self Care | Payer: BLUE CROSS/BLUE SHIELD | Attending: Emergency Medicine | Admitting: Emergency Medicine

## 2018-11-28 DIAGNOSIS — R079 Chest pain, unspecified: Secondary | ICD-10-CM | POA: Diagnosis not present

## 2018-11-28 DIAGNOSIS — E876 Hypokalemia: Secondary | ICD-10-CM | POA: Diagnosis not present

## 2018-11-28 DIAGNOSIS — Z79899 Other long term (current) drug therapy: Secondary | ICD-10-CM | POA: Insufficient documentation

## 2018-11-28 DIAGNOSIS — F419 Anxiety disorder, unspecified: Secondary | ICD-10-CM | POA: Insufficient documentation

## 2018-11-28 LAB — CBC
HCT: 41.3 % (ref 39.0–52.0)
Hemoglobin: 13.4 g/dL (ref 13.0–17.0)
MCH: 23.9 pg — ABNORMAL LOW (ref 26.0–34.0)
MCHC: 32.4 g/dL (ref 30.0–36.0)
MCV: 73.6 fL — ABNORMAL LOW (ref 80.0–100.0)
Platelets: 233 10*3/uL (ref 150–400)
RBC: 5.61 MIL/uL (ref 4.22–5.81)
RDW: 13.9 % (ref 11.5–15.5)
WBC: 7.3 10*3/uL (ref 4.0–10.5)
nRBC: 0 % (ref 0.0–0.2)

## 2018-11-28 LAB — BASIC METABOLIC PANEL
Anion gap: 10 (ref 5–15)
BUN: 13 mg/dL (ref 6–20)
CO2: 23 mmol/L (ref 22–32)
Calcium: 8.9 mg/dL (ref 8.9–10.3)
Chloride: 103 mmol/L (ref 98–111)
Creatinine, Ser: 0.95 mg/dL (ref 0.61–1.24)
GFR calc Af Amer: >60 mL/min (ref >60)
GFR calc non Af Amer: >60 mL/min (ref >60)
Glucose, Bld: 102 mg/dL — ABNORMAL HIGH (ref 70–99)
Potassium: 3.2 mmol/L — ABNORMAL LOW (ref 3.5–5.1)
Sodium: 136 mmol/L (ref 135–145)

## 2018-11-28 LAB — TROPONIN I: Troponin I: <0.03 ng/mL (ref <0.03)

## 2018-11-28 MED ORDER — HYDROXYZINE HCL 25 MG PO TABS
25.0000 mg | ORAL_TABLET | Freq: Once | ORAL | Status: AC
  Administered 2018-11-28: 03:00:00 25 mg via ORAL
  Filled 2018-11-28: qty 1

## 2018-11-28 MED ORDER — LIDOCAINE VISCOUS HCL 2 % MT SOLN
15.0000 mL | Freq: Once | OROMUCOSAL | Status: AC
  Administered 2018-11-28: 15 mL via ORAL
  Filled 2018-11-28: qty 15

## 2018-11-28 MED ORDER — HYDROXYZINE HCL 25 MG PO TABS: 25.0000 mg | ORAL_TABLET | Freq: Four times a day (QID) | ORAL | 0 refills | Status: DC

## 2018-11-28 MED ORDER — ALUM & MAG HYDROXIDE-SIMETH 200-200-20 MG/5ML PO SUSP
30.0000 mL | Freq: Once | ORAL | Status: AC
  Administered 2018-11-28: 30 mL via ORAL
  Filled 2018-11-28: qty 30

## 2018-11-28 MED ORDER — POTASSIUM CHLORIDE CRYS ER 20 MEQ PO TBCR: 20.0000 meq | EXTENDED_RELEASE_TABLET | Freq: Two times a day (BID) | ORAL | 0 refills | Status: DC

## 2018-11-28 NOTE — ED Provider Notes (Signed)
[facility] EMERGENCY DEPARTMENT Provider Note   CSN: [id number] Arrival date & time: 11/28/18  0155    History   Chief Complaint Chief Complaint  Patient presents with  . Chest Pain    HPI Chad Bolton is a 24 y.o. male with a history of gonorrhea and rectal ulceration who presents to the emergency department with a chief complaint of chest pain.  The patient reports intermittent episodes of chest pain over the last 2 to 3 weeks.  He reports the pain has been coming and going after a few seconds, but has been constant for over an hour since onset tonight.  He is unable to characterize the pain.  He reports that the pain started when he laid flat after having a crying episode and feeling more anxious.  Pain seems to improve when he is sitting upright, but is still present.  No other known aggravating or alleviating factors.  He reports that yesterday he was having some intermittent pain with swallowing, but reports that this resolved today.  No sore throat, globus sensation, or increased burping or belching.  He has never been diagnosed with depression or anxiety and has not established with behavioral health.  He denies shortness of breath, palpitations, leg swelling, diaphoresis, fever, chills, cough, abdominal pain, nausea, vomiting, or diarrhea.     The history is provided by the patient. No language interpreter was used.    Past Medical History:  Diagnosis Date  . Gonorrhea    resolved (2019)    Patient Active Problem List   Diagnosis Date Noted  . Rectal ulceration 09/13/2018    Past Surgical History:  Procedure Laterality Date  . COLONOSCOPY WITH PROPOFOL N/A 09/12/2018   Procedure: COLONOSCOPY WITH PROPOFOL;  Surgeon: Carman Ching, MD;  Location: [facility];  Service: Endoscopy;  Laterality: N/A;        Home Medications    Prior to Admission medications   Medication Sig Start Date End Date Taking? Authorizing Provider  acetaminophen  (TYLENOL) 500 MG tablet Take 500 mg by mouth every 6 (six) hours as needed for mild pain or moderate pain.    [provider]  hydrOXYzine (ATARAX/VISTARIL) 25 MG tablet Take 1 tablet (25 mg total) by mouth every 6 (six) hours. 11/28/18   McDonald, Mia A, PA-C  polyethylene glycol (MIRALAX / GLYCOLAX) packet Take 17 g by mouth 2 (two) times daily. 09/13/18   Delano Metz, MD  potassium chloride SA (K-DUR,KLOR-CON) 20 MEQ tablet Take 1 tablet (20 mEq total) by mouth 2 (two) times daily for 5 days. 11/28/18 12/03/18  McDonald, Pedro Earls A, PA-C    Family History No family history on file.  Social History Social History   Tobacco Use  . Smoking status: Never Smoker  . Smokeless tobacco: Never Used  Substance Use Topics  . Alcohol use: No    Frequency: Never  . Drug use: Never     Allergies   Oxycodone   Review of Systems Review of Systems  Constitutional: Negative for appetite change, chills and fever.  HENT: Negative for congestion and sore throat.   Respiratory: Negative for shortness of breath and wheezing.   Cardiovascular: Negative for chest pain, palpitations and leg swelling.  Gastrointestinal: Negative for abdominal pain, diarrhea, nausea and vomiting.  Genitourinary: Negative for dysuria.  Musculoskeletal: Negative for back pain, myalgias, neck pain and neck stiffness.  Skin: Negative for color change, rash and wound.  Allergic/Immunologic: Negative for immunocompromised state.  Neurological: Negative for dizziness, syncope,  weakness, numbness and headaches.  Psychiatric/Behavioral: Positive for dysphoric mood. Negative for confusion, hallucinations, self-injury and suicidal ideas. The patient is nervous/anxious.    Physical Exam Updated Vital Signs BP 122/67 (BP Location: Right Arm)   Pulse 60   Temp 97.6 F (36.4 C) (Oral)   Resp 16   Ht 6' (1.829 m)   Wt 88.5 kg   SpO2 100%   BMI 26.45 kg/m   Physical Exam Vitals signs and nursing note reviewed.   Constitutional:      Appearance: He is well-developed.  HENT:     Head: Normocephalic.     Mouth/Throat:     Lips: No lesions.     Mouth: Mucous membranes are moist.     Pharynx: Oropharynx is clear. Uvula midline. No pharyngeal swelling, oropharyngeal exudate, posterior oropharyngeal erythema or uvula swelling.     Tonsils: No tonsillar exudate or tonsillar abscesses.  Eyes:     Conjunctiva/sclera: Conjunctivae normal.  Neck:     Musculoskeletal: Normal range of motion and neck supple.  Cardiovascular:     Rate and Rhythm: Normal rate and regular rhythm.     Pulses: Normal pulses.     Heart sounds: Normal heart sounds. No murmur. No friction rub.  Pulmonary:     Effort: Pulmonary effort is normal. No respiratory distress.     Breath sounds: No stridor. No wheezing, rhonchi or rales.  Chest:     Chest wall: No tenderness.  Abdominal:     General: There is no distension.     Palpations: Abdomen is soft. There is no mass.     Tenderness: There is no abdominal tenderness. There is no right CVA tenderness, left CVA tenderness, guarding or rebound.     Hernia: No hernia is present.  Skin:    General: Skin is warm and dry.  Neurological:     Mental Status: He is alert.  Psychiatric:        Behavior: Behavior normal.     ED Treatments / Results  Labs (all labs ordered are listed, but only abnormal results are displayed) Labs Reviewed  BASIC METABOLIC PANEL - Abnormal; Notable for the following components:      Result Value   Potassium 3.2 (*)    Glucose, Bld 102 (*)    All other components within normal limits  CBC - Abnormal; Notable for the following components:   MCV 73.6 (*)    MCH 23.9 (*)    All other components within normal limits  TROPONIN I    EKG EKG Interpretation  Date/Time:  Tuesday November 28 2018 02:05:15 EDT Ventricular Rate:  75 PR Interval:    QRS Duration: 80 QT Interval:  346 QTC Calculation: 387 R Axis:   83 Text Interpretation:  Sinus  rhythm No old tracing to compare Confirmed by Rochele RaringWard, Kristen [phone]([id number]) on 11/28/2018 2:32:56 AM   Radiology Dg Chest 2 View  Result Date: 11/28/2018 CLINICAL DATA:  23 y/o  M; 2-3 weeks of chest pain. EXAM: CHEST - 2 VIEW COMPARISON:  None. FINDINGS: The heart size and mediastinal contours are within normal limits. Both lungs are clear. The visualized skeletal structures are unremarkable. IMPRESSION: No active cardiopulmonary disease. Electronically Signed   By: Mitzi HansenLance  Furusawa-Stratton M.D.   On: 11/28/2018 02:43    Procedures Procedures (including critical care time)  Medications Ordered in ED Medications  hydrOXYzine (ATARAX/VISTARIL) tablet 25 mg (25 mg Oral Given 11/28/18 0325)  alum & mag hydroxide-simeth (MAALOX/MYLANTA) 200-200-20 MG/5ML suspension 30 mL (  30 mLs Oral Given 11/28/18 0325)    And  lidocaine (XYLOCAINE) 2 % viscous mouth solution 15 mL (15 mLs Oral Given 11/28/18 0325)     Initial Impression / Assessment and Plan / ED Course  I have reviewed the triage vital signs and the nursing notes.  Pertinent labs & imaging results that were available during my care of the patient were reviewed by me and considered in my medical decision making (see chart for details).        23 year old male with history of gonorrhea unremarkable ulceration presenting with recurrent episodes of chest pain over the last 2 to 3 weeks.  Reports episode tonight began when laying supine after an episode of crying.  Pain has resolved in the ER following hydroxyzine and GI cocktail.  He denies SI, HI, or auditory visual hallucinations.  Patient is to be discharged with recommendation to follow up with [facility] in regards to today's hospital visit. Chest pain is not likely of cardiac or pulmonary etiology d/t presentation, perc negative, VSS, no tracheal deviation, no JVD or new murmur, RRR, breath sounds equal bilaterally, EKG without acute abnormalities, negative troponin, and negative CXR. Pt has been  advised start hydroxyzine and Maalox and return to the ED is CP becomes exertional, associated with diaphoresis or nausea, radiates to left jaw/arm, worsens or becomes concerning in any way. Pt appears reliable for follow up and is agreeable to discharge.    Final Clinical Impressions(s) / ED Diagnoses   Final diagnoses:  Nonspecific chest pain  Anxiety  Hypokalemia    ED Discharge Orders         Ordered    hydrOXYzine (ATARAX/VISTARIL) 25 MG tablet  Every 6 hours     11/28/18 0335    potassium chloride SA (K-DUR,KLOR-CON) 20 MEQ tablet  2 times daily     11/28/18 0335           McDonald, Mia A, PA-C 11/28/18 0711    Ward, Layla Maw, DO 11/28/18 2325

## 2018-11-28 NOTE — Discharge Instructions (Signed)
Thank you for allowing me to care for you today in the Emergency Department.   You workup regarding your heart and lungs was reassuring in the ER.  For concerns about anxiety, you can follow-up with [facility] or use resources through your employer.  Take 1 tablet of hydroxyzine every 6 hours to help with anxiety.  This medication may make you drowsy so do not work until you know how it impacts you.    You can also try taking Maalox over-the-counter if the GI cocktail that you were given tonight helped with your symptoms in the ER.  Take 1 tablet of potassium chloride 2 times daily for the next 5 days since your potassium was slightly low today.  Return to the emergency department if you develop severe shortness of breath, respiratory distress, severe chest pain with a fever, cough, sweating, if you pass out, if your lips or fingers turn blue, or other new, concerning symptoms.

## 2018-11-28 NOTE — ED Triage Notes (Addendum)
Per pt he has been having chest pain for about 2 to 3 weeks. Tonight was worse than others. Pt said that it hurt also when he ate. No SOB  NO NAUSEA OR vomiting, pain does not radiate anywhere. Pt also said he has anxiety and was having it right before his chest started hurting

## 2019-02-24 ENCOUNTER — Emergency Department (HOSPITAL_COMMUNITY)
Admission: EM | Admit: 2019-02-24 | Discharge: 2019-02-24 | Disposition: A | Discharge status: Home / Self Care | Payer: BC Managed Care – PPO | Attending: Emergency Medicine | Admitting: Emergency Medicine

## 2019-02-24 ENCOUNTER — Other Ambulatory Visit: Payer: Self-pay

## 2019-02-24 ENCOUNTER — Encounter (HOSPITAL_COMMUNITY): Payer: Self-pay | Admitting: *Deleted

## 2019-02-24 ENCOUNTER — Emergency Department (HOSPITAL_COMMUNITY): Payer: BC Managed Care – PPO

## 2019-02-24 DIAGNOSIS — Z720 Tobacco use: Secondary | ICD-10-CM | POA: Diagnosis not present

## 2019-02-24 DIAGNOSIS — K6289 Other specified diseases of anus and rectum: Secondary | ICD-10-CM | POA: Diagnosis not present

## 2019-02-24 LAB — CBC WITH DIFFERENTIAL/PLATELET
Abs Immature Granulocytes: 0.01 10*3/uL (ref 0.00–0.07)
Basophils Absolute: 0.1 10*3/uL (ref 0.0–0.1)
Basophils Relative: 1 %
Eosinophils Absolute: 0.1 10*3/uL (ref 0.0–0.5)
Eosinophils Relative: 1 %
HCT: 47.2 % (ref 39.0–52.0)
Hemoglobin: 15.3 g/dL (ref 13.0–17.0)
Immature Granulocytes: 0 %
Lymphocytes Relative: 24 %
Lymphs Abs: 2.1 10*3/uL (ref 0.7–4.0)
MCH: 24.6 pg — ABNORMAL LOW (ref 26.0–34.0)
MCHC: 32.4 g/dL (ref 30.0–36.0)
MCV: 75.9 fL — ABNORMAL LOW (ref 80.0–100.0)
Monocytes Absolute: 0.8 10*3/uL (ref 0.1–1.0)
Monocytes Relative: 9 %
Neutro Abs: 5.8 10*3/uL (ref 1.7–7.7)
Neutrophils Relative %: 65 %
Platelets: 304 10*3/uL (ref 150–400)
RBC: 6.22 MIL/uL — ABNORMAL HIGH (ref 4.22–5.81)
RDW: 18.6 % — ABNORMAL HIGH (ref 11.5–15.5)
WBC: 8.8 10*3/uL (ref 4.0–10.5)
nRBC: 0 % (ref 0.0–0.2)

## 2019-02-24 LAB — URINALYSIS, ROUTINE W REFLEX MICROSCOPIC
Bilirubin Urine: NEGATIVE
Glucose, UA: NEGATIVE mg/dL
Hgb urine dipstick: NEGATIVE
Ketones, ur: 5 mg/dL — AB
Leukocytes,Ua: NEGATIVE
Nitrite: NEGATIVE
Protein, ur: NEGATIVE mg/dL
Specific Gravity, Urine: 1.016 (ref 1.005–1.030)
pH: 6 (ref 5.0–8.0)

## 2019-02-24 LAB — BASIC METABOLIC PANEL
Anion gap: 8 (ref 5–15)
BUN: 10 mg/dL (ref 6–20)
CO2: 27 mmol/L (ref 22–32)
Calcium: 9.3 mg/dL (ref 8.9–10.3)
Chloride: 102 mmol/L (ref 98–111)
Creatinine, Ser: 0.88 mg/dL (ref 0.61–1.24)
GFR calc Af Amer: >60 mL/min (ref >60)
GFR calc non Af Amer: >60 mL/min (ref >60)
Glucose, Bld: 100 mg/dL — ABNORMAL HIGH (ref 70–99)
Potassium: 3.6 mmol/L (ref 3.5–5.1)
Sodium: 137 mmol/L (ref 135–145)

## 2019-02-24 LAB — TSH: TSH: 2.116 u[IU]/mL (ref 0.350–4.500)

## 2019-02-24 MED ORDER — DIBUCAINE (PERIANAL) 1 % EX OINT: 1.0000 "application " | TOPICAL_OINTMENT | CUTANEOUS | 0 refills | Status: DC | PRN

## 2019-02-24 MED ORDER — CEFTRIAXONE SODIUM 250 MG IJ SOLR
250.0000 mg | Freq: Once | INTRAMUSCULAR | Status: AC
  Administered 2019-02-24: 250 mg via INTRAMUSCULAR
  Filled 2019-02-24: qty 250

## 2019-02-24 MED ORDER — AZITHROMYCIN 250 MG PO TABS
1000.0000 mg | ORAL_TABLET | Freq: Once | ORAL | Status: AC
  Administered 2019-02-24: 1000 mg via ORAL
  Filled 2019-02-24: qty 4

## 2019-02-24 MED ORDER — DOXYCYCLINE HYCLATE 100 MG PO CAPS: 100.0000 mg | ORAL_CAPSULE | Freq: Two times a day (BID) | ORAL | 0 refills | Status: DC

## 2019-02-24 MED ORDER — IOHEXOL 300 MG/ML  SOLN
100.0000 mL | Freq: Once | INTRAMUSCULAR | Status: AC | PRN
  Administered 2019-02-24: 100 mL via INTRAVENOUS

## 2019-02-24 MED ORDER — LIDOCAINE HCL (PF) 1 % IJ SOLN
INTRAMUSCULAR | Status: AC
  Administered 2019-02-24: 5 mL via INTRAMUSCULAR
  Filled 2019-02-24: qty 5

## 2019-02-24 NOTE — ED Notes (Signed)
Pt returned to room from CT

## 2019-02-24 NOTE — ED Notes (Signed)
Patient transported to CT 

## 2019-02-24 NOTE — ED Triage Notes (Signed)
C/o rectal pain for several day , c/o increased pain with bowel movement.

## 2019-02-24 NOTE — ED Provider Notes (Signed)
[place] EMERGENCY DEPARTMENT Provider Note   CSN: [id number] Arrival date & time: 02/24/19  3532     History   Chief Complaint Chief Complaint  Patient presents with  . Rectal Bleeding    HPI Chad Bolton is a 23 y.o. male who presents with 6 days of Rectal pain. The patient is in a monogamous relationship with a single male partner. He participates in receptive anal intercourse. He has a hx of gonorrhea, rectal ulcer and constipation. The patient had intercourse with his boyfriend on 9 days ago. He had several episodes of straining BMs over the weekend. On Monday he began having a stinging pain in is rectum whenever he would make a BM. He also noticed mucoid dc from his anus. The pain has progressively worsened and is now constant, worse with straining and BMs. He has difficulty finding a position of comfort. He has been using rectal hemorrhoid suppositories without relief of his sxs. The patient denies fevers or chills.      The history is provided by the patient and medical records.    Past Medical History:  Diagnosis Date  . Gonorrhea    resolved (2019)    Patient Active Problem List   Diagnosis Date Noted  . Rectal ulceration 09/13/2018    Past Surgical History:  Procedure Laterality Date  . COLONOSCOPY WITH PROPOFOL N/A 09/12/2018   Procedure: COLONOSCOPY WITH PROPOFOL;  Surgeon: Laurence Spates, MD;  Location: [facility];  Service: Endoscopy;  Laterality: N/A;        Home Medications    Prior to Admission medications   Medication Sig Start Date End Date Taking? Authorizing Provider  hydrOXYzine (ATARAX/VISTARIL) 25 MG tablet Take 1 tablet (25 mg total) by mouth every 6 (six) hours. Patient not taking: Reported on 02/24/2019 11/28/18   McDonald, Mia A, PA-C  polyethylene glycol (MIRALAX / GLYCOLAX) packet Take 17 g by mouth 2 (two) times daily. Patient not taking: Reported on 02/24/2019 09/13/18   Roney Jaffe, MD  potassium chloride SA  (K-DUR,KLOR-CON) 20 MEQ tablet Take 1 tablet (20 mEq total) by mouth 2 (two) times daily for 5 days. Patient not taking: Reported on 02/24/2019 11/28/18 02/24/28  Joanne Gavel, PA-C    Family History No family history on file.  Social History Social History   Tobacco Use  . Smoking status: Current Some Day Smoker  . Smokeless tobacco: Never Used  Substance Use Topics  . Alcohol use: Yes    Frequency: Never  . Drug use: Never     Allergies   Oxycodone   Review of Systems Review of Systems Ten systems reviewed and are negative for acute change, except as noted in the HPI.    Physical Exam Updated Vital Signs BP 139/86 (BP Location: Right Arm)   Pulse 79   Temp 97.8 F (36.6 C) (Oral)   Resp 14   Ht 6' (1.829 m)   Wt 83.9 kg   SpO2 99%   BMI 25.09 kg/m   Physical Exam Vitals signs and nursing note reviewed. Exam conducted with a chaperone present.  Constitutional:      General: He is not in acute distress.    Appearance: He is well-developed. He is not diaphoretic.  HENT:     Head: Normocephalic and atraumatic.  Eyes:     General: No scleral icterus.    Conjunctiva/sclera: Conjunctivae normal.  Neck:     Musculoskeletal: Normal range of motion and neck supple.  Cardiovascular:  Rate and Rhythm: Normal rate and regular rhythm.     Heart sounds: Normal heart sounds.  Pulmonary:     Effort: Pulmonary effort is normal. No respiratory distress.     Breath sounds: Normal breath sounds.  Abdominal:     Palpations: Abdomen is soft.     Tenderness: There is no abdominal tenderness.  Genitourinary:    Rectum: Tenderness present. No mass, anal fissure, external hemorrhoid or internal hemorrhoid. Normal anal tone.    Skin:    General: Skin is warm and dry.  Neurological:     Mental Status: He is alert.  Psychiatric:        Behavior: Behavior normal.      ED Treatments / Results  Labs (all labs ordered are listed, but only abnormal results are  displayed) Labs Reviewed - No data to display  EKG None  Radiology No results found.  Procedures Procedures (including critical care time)  Medications Ordered in ED Medications - No data to display   Initial Impression / Assessment and Plan / ED Course  I have reviewed the triage vital signs and the nursing notes.  Pertinent labs & imaging results that were available during my care of the patient were reviewed by me and considered in my medical decision making (see chart for details).       23 year old male with high risk sexual behavior although in a monogamous relationship here with rectal pain.  I reviewed the patient's labs which show no acute abnormalities.  The patient has microcytosis without low hemoglobin.  His blood glucose is 100 without other metabolic abnormality.  TSH is normal.  Urine without signs of infection.  Patient's HIV, RPR and GC chlamydia swabs are currently pending.  I personally reviewed the patient's CT pelvis which shows some free fluid and iteration of his previous infection will treat for potential PID and infection.  Patient received Rocephin and azithromycin.  He will be given a two-week course of doxycycline.  He is advised not to have intercourse until has had return of his lab values.  He is given dibucaine for treatment of rectal pain and discomfort.  Had a long conversation about patient being in a high risk category for HIV transmission and offered the patient outpatient referral to infectious disease center for prep therapy.  Patient states that he will think about it.  Patient appears appropriate for discharge at this time.   Final Clinical Impressions(s) / ED Diagnoses   Final diagnoses:  None    ED Discharge Orders    None       Arthor CaptainHarris, Mauricio Dahlen, PA-C 02/24/19 1633    Arby BarrettePfeiffer, Marcy, MD 02/25/19 [phone]

## 2019-02-24 NOTE — ED Notes (Signed)
Pt discharged with all belongings. Discharge instructions reviewed with pt, and pt verbalized understanding. Opportunity for questions provided.  

## 2019-02-25 LAB — HIV ANTIBODY (ROUTINE TESTING W REFLEX): HIV Screen 4th Generation wRfx: NONREACTIVE

## 2019-02-25 LAB — RPR: RPR Ser Ql: NONREACTIVE

## 2019-02-26 ENCOUNTER — Telehealth (HOSPITAL_COMMUNITY): Payer: Self-pay

## 2019-02-27 LAB — GC/CHLAMYDIA PROBE AMP (~~LOC~~) NOT AT ARMC
Chlamydia: NEGATIVE
Neisseria Gonorrhea: POSITIVE — AB

## 2019-05-30 ENCOUNTER — Encounter (HOSPITAL_COMMUNITY): Payer: Self-pay | Admitting: Emergency Medicine

## 2019-05-30 ENCOUNTER — Emergency Department (HOSPITAL_COMMUNITY)
Admission: EM | Admit: 2019-05-30 | Discharge: 2019-05-30 | Disposition: A | Discharge status: Home / Self Care | Payer: BC Managed Care – PPO | Attending: Emergency Medicine | Admitting: Emergency Medicine

## 2019-05-30 ENCOUNTER — Other Ambulatory Visit: Payer: Self-pay

## 2019-05-30 DIAGNOSIS — R369 Urethral discharge, unspecified: Secondary | ICD-10-CM | POA: Diagnosis present

## 2019-05-30 DIAGNOSIS — F17210 Nicotine dependence, cigarettes, uncomplicated: Secondary | ICD-10-CM | POA: Insufficient documentation

## 2019-05-30 DIAGNOSIS — Z202 Contact with and (suspected) exposure to infections with a predominantly sexual mode of transmission: Secondary | ICD-10-CM | POA: Diagnosis not present

## 2019-05-30 LAB — HIV ANTIBODY (ROUTINE TESTING W REFLEX): HIV Screen 4th Generation wRfx: NONREACTIVE

## 2019-05-30 MED ORDER — LIDOCAINE HCL (PF) 1 % IJ SOLN
INTRAMUSCULAR | Status: AC
  Administered 2019-05-30: 11:00:00 5 mL
  Filled 2019-05-30: qty 5

## 2019-05-30 MED ORDER — CEFTRIAXONE SODIUM 250 MG IJ SOLR
250.0000 mg | Freq: Once | INTRAMUSCULAR | Status: AC
  Administered 2019-05-30: 250 mg via INTRAMUSCULAR
  Filled 2019-05-30: qty 250

## 2019-05-30 MED ORDER — AZITHROMYCIN 250 MG PO TABS
1000.0000 mg | ORAL_TABLET | Freq: Once | ORAL | Status: AC
  Administered 2019-05-30: 1000 mg via ORAL
  Filled 2019-05-30: qty 4

## 2019-05-30 NOTE — ED Triage Notes (Signed)
Pt c/o penile discharge- states "ex cheated on me-- discharge and burning for 3 days"

## 2019-05-30 NOTE — ED Provider Notes (Signed)
Emergency Department Provider Note   I have reviewed the triage vital signs and the nursing notes.   HISTORY  Chief Complaint STD check   HPI Chad Bolton is a 23 y.o. male with past history of gonorrhea presents to the emergency department with penile discharge and burning for the past 3 days.  Patient states that he found out that his ex has been cheating and is concerned regarding STD.  Patient states that he recently was treated for similar symptoms and was found to have gonorrhea at that time.  He reports that his symptoms completely resolved but then returned 3 days later in this setting.  No fevers or chills.  No joint pain.  No confusion.   Past Medical History:  Diagnosis Date  . Gonorrhea    resolved (2019)    Patient Active Problem List   Diagnosis Date Noted  . Rectal ulceration 09/13/2018    Past Surgical History:  Procedure Laterality Date  . COLONOSCOPY WITH PROPOFOL N/A 09/12/2018   Procedure: COLONOSCOPY WITH PROPOFOL;  Surgeon: Laurence Spates, MD;  Location: [facility];  Service: Endoscopy;  Laterality: N/A;    Allergies Oxycodone  No family history on file.  Social History Social History   Tobacco Use  . Smoking status: Current Some Day Smoker  . Smokeless tobacco: Never Used  Substance Use Topics  . Alcohol use: Yes    Frequency: Never  . Drug use: Never    Review of Systems  Constitutional: No fever/chills ENT: No sore throat. Cardiovascular: Denies chest pain. Respiratory: Denies shortness of breath. Gastrointestinal: No abdominal pain.  Genitourinary: Positive dysuria and urethral discharge.  Musculoskeletal: No joint pain.  Skin: Negative for rash. Neurological: Negative for headaches.  10-point ROS otherwise negative.  ____________________________________________   PHYSICAL EXAM:  VITAL SIGNS: ED Triage Vitals [05/30/19 1004]  Enc Vitals Group     BP 126/84     Pulse Rate 73     Resp 20     Temp 97.8 F (36.6  C)     Temp Source Oral     SpO2 99 %   Constitutional: Alert and oriented. Well appearing and in no acute distress. Eyes: Conjunctivae are normal. Head: Atraumatic. Nose: No congestion/rhinnorhea. Mouth/Throat: Mucous membranes are moist.  Neck: No stridor.  Cardiovascular: Normal rate, regular rhythm. Respiratory: Normal respiratory effort.   Gastrointestinal: No distention.  Musculoskeletal: No gross deformities of extremities. Neurologic:  Normal speech and language. Skin:  Skin is warm, dry and intact. No rash noted.  ____________________________________________   LABS (all labs ordered are listed, but only abnormal results are displayed)  Labs Reviewed  HIV ANTIBODY (ROUTINE TESTING W REFLEX)  HIV4GL SAVE TUBE  RPR  GC/CHLAMYDIA PROBE AMP ([place]) NOT AT [facility]   ____________________________________________   PROCEDURES  Procedure(s) performed:   Procedures  None  ____________________________________________   INITIAL IMPRESSION / ASSESSMENT AND PLAN / ED COURSE  Pertinent labs & imaging results that were available during my care of the patient were reviewed by me and considered in my medical decision making (see chart for details).   Patient presents to the emergency department with urethral discharge and dysuria consistent with STD.  He has had these symptoms in the past and found out recently that his significant other has been cheating on him.  We will treat with azithromycin and Rocephin empirically.  Urine test sent.  HIV and RPR blood test also sent.  Patient will follow results in [place].  Discussed safe sex practices  and need for PCP follow-up.   ____________________________________________  FINAL CLINICAL IMPRESSION(S) / ED DIAGNOSES  Final diagnoses:  STD exposure  Urethral discharge in male     MEDICATIONS GIVEN DURING THIS VISIT:  Medications  azithromycin (ZITHROMAX) tablet 1,000 mg (has no administration in time range)   cefTRIAXone (ROCEPHIN) injection 250 mg (has no administration in time range)     Note:  This document was prepared using Dragon voice recognition software and may include unintentional dictation errors.  Alona Bene, MD, [facility]    Long, Arlyss Repress, MD 05/30/19 1106

## 2019-05-30 NOTE — Discharge Instructions (Signed)

## 2019-05-31 LAB — RPR: RPR Ser Ql: NONREACTIVE

## 2019-06-01 LAB — GC/CHLAMYDIA PROBE AMP (~~LOC~~) NOT AT ARMC
Chlamydia: NEGATIVE
Neisseria Gonorrhea: NEGATIVE

## 2019-07-09 ENCOUNTER — Encounter (HOSPITAL_COMMUNITY): Payer: Self-pay | Admitting: Emergency Medicine

## 2019-07-09 ENCOUNTER — Other Ambulatory Visit: Payer: Self-pay

## 2019-07-09 ENCOUNTER — Emergency Department (HOSPITAL_COMMUNITY)
Admission: EM | Admit: 2019-07-09 | Discharge: 2019-07-09 | Disposition: A | Discharge status: Home / Self Care | Payer: BC Managed Care – PPO | Attending: Emergency Medicine | Admitting: Emergency Medicine

## 2019-07-09 DIAGNOSIS — K6289 Other specified diseases of anus and rectum: Secondary | ICD-10-CM | POA: Insufficient documentation

## 2019-07-09 DIAGNOSIS — R198 Other specified symptoms and signs involving the digestive system and abdomen: Secondary | ICD-10-CM | POA: Diagnosis not present

## 2019-07-09 DIAGNOSIS — F17200 Nicotine dependence, unspecified, uncomplicated: Secondary | ICD-10-CM | POA: Diagnosis not present

## 2019-07-09 LAB — RPR: RPR Ser Ql: NONREACTIVE

## 2019-07-09 LAB — HIV ANTIBODY (ROUTINE TESTING W REFLEX): HIV Screen 4th Generation wRfx: NONREACTIVE

## 2019-07-09 MED ORDER — AZITHROMYCIN 250 MG PO TABS
1000.0000 mg | ORAL_TABLET | Freq: Once | ORAL | Status: AC
  Administered 2019-07-09: 12:00:00 1000 mg via ORAL
  Filled 2019-07-09: qty 4

## 2019-07-09 MED ORDER — LIDOCAINE HCL (PF) 1 % IJ SOLN
INTRAMUSCULAR | Status: AC
  Administered 2019-07-09: 1 mL
  Filled 2019-07-09: qty 5

## 2019-07-09 MED ORDER — CEFTRIAXONE SODIUM 250 MG IJ SOLR
250.0000 mg | Freq: Once | INTRAMUSCULAR | Status: AC
  Administered 2019-07-09: 250 mg via INTRAMUSCULAR
  Filled 2019-07-09: qty 250

## 2019-07-09 NOTE — ED Provider Notes (Signed)
[facility] EMERGENCY DEPARTMENT Provider Note   CSN: [id number] Arrival date & time: 07/09/19  4944     History   Chief Complaint Chief Complaint  Patient presents with   Rectal Pain    HPI Chad Bolton is a 23 y.o. male with history of STD who presents with rectal pain and discharge.  Patient states that over the past week he has had increasing pain in his rectum feels like a burning sensation.  He will feel like he needs to have a bowel movement and when he will go it will only be mucus that comes out.  He had similar symptoms several months ago and was diagnosed with gonorrhea and after treatment his symptoms improved.  He states that he stopped seeing that partner however he did have oral and anal sex with him a couple of weeks ago.  Patient denies fever, abdominal pain, rectal bleeding.  He has previously seen GI and had a colonoscopy and flex sigmoidoscopy which showed internal hemorrhoids.  No history of IBD     HPI  Past Medical History:  Diagnosis Date   Gonorrhea    resolved (2019)    Patient Active Problem List   Diagnosis Date Noted   Rectal ulceration 09/13/2018    Past Surgical History:  Procedure Laterality Date   COLONOSCOPY WITH PROPOFOL N/A 09/12/2018   Procedure: COLONOSCOPY WITH PROPOFOL;  Surgeon: Carman Ching, MD;  Location: [facility];  Service: Endoscopy;  Laterality: N/A;        Home Medications    Prior to Admission medications   Medication Sig Start Date End Date Taking? Authorizing Provider  dibucaine (NUPERCAINAL) 1 % OINT Place 1 application rectally as needed for anal irritation. 02/24/19   Arthor Captain, PA-C  doxycycline (VIBRAMYCIN) 100 MG capsule Take 1 capsule (100 mg total) by mouth 2 (two) times daily. 02/24/19   Arthor Captain, PA-C  hydrOXYzine (ATARAX/VISTARIL) 25 MG tablet Take 1 tablet (25 mg total) by mouth every 6 (six) hours. Patient not taking: Reported on 02/24/2019 11/28/18   McDonald, Mia A,  PA-C  polyethylene glycol (MIRALAX / GLYCOLAX) packet Take 17 g by mouth 2 (two) times daily. Patient not taking: Reported on 02/24/2019 09/13/18   Delano Metz, MD  potassium chloride SA (K-DUR,KLOR-CON) 20 MEQ tablet Take 1 tablet (20 mEq total) by mouth 2 (two) times daily for 5 days. Patient not taking: Reported on 02/24/2019 11/28/18 02/24/28  Barkley Boards, PA-C    Family History No family history on file.  Social History Social History   Tobacco Use   Smoking status: Current Some Day Smoker   Smokeless tobacco: Never Used  Substance Use Topics   Alcohol use: Yes    Frequency: Never   Drug use: Never     Allergies   Oxycodone   Review of Systems Review of Systems  Constitutional: Negative for fever.  Gastrointestinal: Positive for rectal pain. Negative for abdominal pain.       +rectal discharge     Physical Exam Updated Vital Signs BP 131/80    Pulse 76    Temp 97.6 F (36.4 C) (Oral)    Resp 16    Ht 6' (1.829 m)    Wt 83.5 kg    SpO2 96%    BMI 24.95 kg/m   Physical Exam Vitals signs and nursing note reviewed.  Constitutional:      General: He is not in acute distress.    Appearance: Normal appearance. He is well-developed.  He is not ill-appearing.  HENT:     Head: Normocephalic and atraumatic.  Eyes:     General: No scleral icterus.       Right eye: No discharge.        Left eye: No discharge.     Conjunctiva/sclera: Conjunctivae normal.     Pupils: Pupils are equal, round, and reactive to light.  Neck:     Musculoskeletal: Normal range of motion.  Cardiovascular:     Rate and Rhythm: Normal rate.  Pulmonary:     Effort: Pulmonary effort is normal. No respiratory distress.  Abdominal:     General: There is no distension.  Genitourinary:    Comments: Rectum: No gross blood, hemorrhoids, fissures, redness, area of fluctuance, lesions. He did have significant tenderness when swab was obtained therefore digital exam was deferred. Chaperone  present during exam.  Skin:    General: Skin is warm and dry.  Neurological:     Mental Status: He is alert and oriented to person, place, and time.  Psychiatric:        Behavior: Behavior normal.      ED Treatments / Results  Labs (all labs ordered are listed, but only abnormal results are displayed) Labs Reviewed  RPR  HIV ANTIBODY (ROUTINE TESTING W REFLEX)  GC/CHLAMYDIA PROBE AMP ([place]) NOT AT [facility]    EKG None  Radiology No results found.  Procedures Procedures (including critical care time)  Medications Ordered in ED Medications  azithromycin (ZITHROMAX) tablet 1,000 mg (has no administration in time range)  cefTRIAXone (ROCEPHIN) injection 250 mg (has no administration in time range)     Initial Impression / Assessment and Plan / ED Course  I have reviewed the triage vital signs and the nursing notes.  Pertinent labs & imaging results that were available during my care of the patient were reviewed by me and considered in my medical decision making (see chart for details).  23 year old male presents with rectal pain and discharge over the past week similar to when he had gonorrhea several months ago.  He is with the same partner who gave him gonorrhea last time.  His vitals are normal.  His abdomen is soft and nontender.  Rectal exam is unremarkable.  Rectal swab was obtained.  Patient is requesting treatment today.  He was given empiric treatment with Rocephin and azithromycin.  He is also requesting screening for HIV and syphilis.  He is advised to follow-up with GI if symptoms are not improving after treatment.   Final Clinical Impressions(s) / ED Diagnoses   Final diagnoses:  Rectal pain  Rectal discharge    ED Discharge Orders    None       Iris Pert 07/09/19 [place], Nathan, MD 07/09/19 1527

## 2019-07-09 NOTE — ED Triage Notes (Signed)
Pt endorses rectal pain since last week. Denies any rectal bleeding or urinary symptoms. Reports mucus when having a BM.

## 2019-07-09 NOTE — Discharge Instructions (Signed)
Do not have sex for 2 weeks until infectious clears Have all partners tested and treated If your test is abnormal, you will be called but you have been treated for Gonorrhea and Chlamydia today. You can also review your results on MyChart Follow up with GI if you continue to have symptoms

## 2019-07-10 LAB — GC/CHLAMYDIA PROBE AMP (~~LOC~~) NOT AT ARMC
Chlamydia: POSITIVE — AB
Neisseria Gonorrhea: NEGATIVE

## 2019-09-01 ENCOUNTER — Encounter (HOSPITAL_COMMUNITY): Payer: Self-pay | Admitting: Pharmacy Technician

## 2019-09-01 ENCOUNTER — Other Ambulatory Visit: Payer: Self-pay

## 2019-09-01 ENCOUNTER — Emergency Department (HOSPITAL_COMMUNITY)
Admission: EM | Admit: 2019-09-01 | Discharge: 2019-09-01 | Disposition: A | Discharge status: Home / Self Care | Payer: BC Managed Care – PPO | Attending: Emergency Medicine | Admitting: Emergency Medicine

## 2019-09-01 DIAGNOSIS — L03211 Cellulitis of face: Secondary | ICD-10-CM | POA: Diagnosis not present

## 2019-09-01 DIAGNOSIS — L0201 Cutaneous abscess of face: Secondary | ICD-10-CM | POA: Diagnosis present

## 2019-09-01 MED ORDER — NAPROXEN 500 MG PO TABS: 500.0000 mg | ORAL_TABLET | Freq: Two times a day (BID) | ORAL | 0 refills | Status: AC

## 2019-09-01 MED ORDER — SULFAMETHOXAZOLE-TRIMETHOPRIM 800-160 MG PO TABS: 1.0000 | ORAL_TABLET | Freq: Two times a day (BID) | ORAL | 0 refills | Status: AC

## 2019-09-01 NOTE — ED Provider Notes (Signed)
[facility] EMERGENCY DEPARTMENT Provider Note   CSN: [id number] Arrival date & time: 09/01/19  1222     History Chief Complaint  Patient presents with  . Abscess    Chad Bolton is a 24 y.o. male presenting to emergency department today with chief complaint of forehead abscess x2 days.  He states he first noticed the bump when he woke up yesterday Throughout the day he had a throbbing and aching sensation localized to the area.  He denies any known injury or trauma or known insect bite.  He has tried taking Tylenol without pain relief.  He rates the pain 7 out of 10 in severity.  He states had difficulty sleeping last night because of the pain.  He denies any fever, chills, headache, neck pain, purulent drainage. Denies history of MRSA, HIV, diabetes or other immunocompromising conditions.     Past Medical History:  Diagnosis Date  . Gonorrhea    resolved (2019)    Patient Active Problem List   Diagnosis Date Noted  . Rectal ulceration 09/13/2018    Past Surgical History:  Procedure Laterality Date  . COLONOSCOPY WITH PROPOFOL N/A 09/12/2018   Procedure: COLONOSCOPY WITH PROPOFOL;  Surgeon: Carman Ching, MD;  Location: [facility];  Service: Endoscopy;  Laterality: N/A;       No family history on file.  Social History   Tobacco Use  . Smoking status: Current Some Day Smoker  . Smokeless tobacco: Never Used  Substance Use Topics  . Alcohol use: Yes  . Drug use: Never    Home Medications Prior to Admission medications   Medication Sig Start Date End Date Taking? Authorizing Provider  dibucaine (NUPERCAINAL) 1 % OINT Place 1 application rectally as needed for anal irritation. 02/24/19   Arthor Captain, PA-C  doxycycline (VIBRAMYCIN) 100 MG capsule Take 1 capsule (100 mg total) by mouth 2 (two) times daily. 02/24/19   Arthor Captain, PA-C  hydrOXYzine (ATARAX/VISTARIL) 25 MG tablet Take 1 tablet (25 mg total) by mouth every 6 (six)  hours. Patient not taking: Reported on 02/24/2019 11/28/18   McDonald, Mia A, PA-C  naproxen (NAPROSYN) 500 MG tablet Take 1 tablet (500 mg total) by mouth 2 (two) times daily for 7 days. 09/01/19 09/08/19  Amillion Scobee E, PA-C  polyethylene glycol (MIRALAX / GLYCOLAX) packet Take 17 g by mouth 2 (two) times daily. Patient not taking: Reported on 02/24/2019 09/13/18   Delano Metz, MD  potassium chloride SA (K-DUR,KLOR-CON) 20 MEQ tablet Take 1 tablet (20 mEq total) by mouth 2 (two) times daily for 5 days. Patient not taking: Reported on 02/24/2019 11/28/18 02/24/28  McDonald, Mia A, PA-C  sulfamethoxazole-trimethoprim (BACTRIM DS) 800-160 MG tablet Take 1 tablet by mouth 2 (two) times daily for 7 days. 09/01/19 09/08/19  Kirbi Farrugia, Caroleen Hamman, PA-C    Allergies    Oxycodone  Review of Systems   Review of Systems All other systems are reviewed and are negative for acute change except as noted in the HPI.  Physical Exam Updated Vital Signs BP 124/75 (BP Location: Right Arm)   Pulse 87   Temp 97.8 F (36.6 C)   Resp 14   SpO2 98%   Physical Exam Vitals and nursing note reviewed.  Constitutional:      Appearance: He is well-developed. He is not ill-appearing or toxic-appearing.  HENT:     Head: Normocephalic.     Comments: Please see media below.  There is a approximately 3 x 5 raised area  on patient's forehead.  There is no palpable fluctuance.  Minimal induration.  No overlying erythema, no drainage.  No surrounding erythema or streaking.    Nose: Nose normal.  Eyes:     General: No scleral icterus.       Right eye: No discharge.        Left eye: No discharge.     Conjunctiva/sclera: Conjunctivae normal.  Neck:     Vascular: No JVD.  Cardiovascular:     Rate and Rhythm: Normal rate and regular rhythm.     Pulses: Normal pulses.     Heart sounds: Normal heart sounds.  Pulmonary:     Effort: Pulmonary effort is normal.     Breath sounds: Normal breath sounds.  Abdominal:      General: There is no distension.  Musculoskeletal:        General: Normal range of motion.     Cervical back: Normal range of motion. No tenderness.  Skin:    General: Skin is warm and dry.  Neurological:     Mental Status: He is oriented to person, place, and time.     GCS: GCS eye subscore is 4. GCS verbal subscore is 5. GCS motor subscore is 6.     Comments: Fluent speech, no facial droop.  Psychiatric:        Behavior: Behavior normal.       ED Results / Procedures / Treatments   Labs (all labs ordered are listed, but only abnormal results are displayed) Labs Reviewed - No data to display  EKG None  Radiology No results found.  Procedures Procedures (including critical care time)  Medications Ordered in ED Medications - No data to display  ED Course  I have reviewed the triage vital signs and the nursing notes.  Pertinent labs & imaging results that were available during my care of the patient were reviewed by me and considered in my medical decision making (see chart for details).    MDM Rules/Calculators/A&P                      Patient seen and examined. Patient presents awake, alert, hemodynamically stable, afebrile, non toxic.  He is without risk factors for HIV; no recent use of steroids or other immunosuppressive medications; no Hx of diabetes.  Pt is without gross abscess for which I&D would be possible.  Discussed marking the area with a skin marker however given his on his forehead patient would rather closely monitor instead. He will take pictures of this phone to fully document any changes. Pt encouraged to return if redness begins to streak, extends beyond the markings, and/or fever or nausea/vomiting develop.  Pt is alert, oriented, NAD, afebrile, non tachycardic, nonseptic and nontoxic appearing.  Pt to be d/c on oral antibiotics with strict f/u instructions.  Recommend he follow-up with PCP for recheck in 2 days.   Portions of this note were generated  with [employer]. Dictation errors may occur despite best attempts at proofreading.   Final Clinical Impression(s) / ED Diagnoses Final diagnoses:  Cellulitis of face    Rx / DC Orders ED Discharge Orders         Ordered    sulfamethoxazole-trimethoprim (BACTRIM DS) 800-160 MG tablet  2 times daily     09/01/19 1301    naproxen (NAPROSYN) 500 MG tablet  2 times daily     09/01/19 1301           Shenoa Hattabaugh,  Caroleen Hamman, PA-C 09/01/19 1353    Derwood Kaplan, MD 09/02/19 [phone]

## 2019-09-01 NOTE — ED Triage Notes (Signed)
Pt arrives pov with reports of abscess to forehead. Onset yesterday worsening overnight. Denies fevers/chills.

## 2019-09-01 NOTE — ED Notes (Signed)
Patient Alert and oriented to baseline. Stable and ambulatory to baseline. Patient verbalized understanding of the discharge instructions.  Patient belongings were taken by the patient.   

## 2019-09-01 NOTE — Discharge Instructions (Addendum)
You have been seen today for possible abscess/facial swelling.  Please read and follow all provided instructions. Return to the emergency room for worsening condition or new concerning symptoms including if the area becomes bigger, there is worsening redness or streaking around the area of swelling, you get fever or chills, there starts to be draining pus.  1. Medications:  -Prescription sent to your pharmacy for Bactrim.  This is an antibiotic used to treat cellulitis could be caused by possible insect bite.  These take as prescribed. -Prescription also sent for naproxen.  This is an anti-inflammatory medicine this helps with swelling.  Please take as prescribed.  You should take this medicine with food as it can cause upset stomach.  Do not take any additional ibuprofen, Aleve, Motrin as these are all the same kind of medicine.  Continue usual home medications Take medications as prescribed. Please review all of the medicines and only take them if you do not have an allergy to them.   2. Treatment: rest, drink plenty of fluids  3. Follow Up: Please call your primary care doctor first thing Monday morning to schedule a follow-up appointment.  We would like someone to evaluate your forehead to make sure the swelling does not get worse and is improving on the antibiotics.   It is also a possibility that you have an allergic reaction to any of the medicines that you have been prescribed - Everybody reacts differently to medications and while MOST people have no trouble with most medicines, you may have a reaction such as nausea, vomiting, rash, swelling, shortness of breath. If this is the case, please stop taking the medicine immediately and contact your physician.  ?

## 2019-09-10 ENCOUNTER — Emergency Department (HOSPITAL_COMMUNITY)
Admission: EM | Admit: 2019-09-10 | Discharge: 2019-09-10 | Disposition: A | Discharge status: Home / Self Care | Payer: BC Managed Care – PPO | Attending: Emergency Medicine | Admitting: Emergency Medicine

## 2019-09-10 ENCOUNTER — Encounter (HOSPITAL_COMMUNITY): Payer: Self-pay | Admitting: Emergency Medicine

## 2019-09-10 ENCOUNTER — Other Ambulatory Visit: Payer: Self-pay

## 2019-09-10 DIAGNOSIS — Y999 Unspecified external cause status: Secondary | ICD-10-CM | POA: Diagnosis not present

## 2019-09-10 DIAGNOSIS — W5501XA Bitten by cat, initial encounter: Secondary | ICD-10-CM | POA: Insufficient documentation

## 2019-09-10 DIAGNOSIS — Y939 Activity, unspecified: Secondary | ICD-10-CM | POA: Diagnosis not present

## 2019-09-10 DIAGNOSIS — F17200 Nicotine dependence, unspecified, uncomplicated: Secondary | ICD-10-CM | POA: Insufficient documentation

## 2019-09-10 DIAGNOSIS — S61452A Open bite of left hand, initial encounter: Secondary | ICD-10-CM | POA: Insufficient documentation

## 2019-09-10 DIAGNOSIS — Y929 Unspecified place or not applicable: Secondary | ICD-10-CM | POA: Insufficient documentation

## 2019-09-10 DIAGNOSIS — S6992XA Unspecified injury of left wrist, hand and finger(s), initial encounter: Secondary | ICD-10-CM | POA: Diagnosis present

## 2019-09-10 DIAGNOSIS — Z79899 Other long term (current) drug therapy: Secondary | ICD-10-CM | POA: Insufficient documentation

## 2019-09-10 MED ORDER — TETANUS-DIPHTH-ACELL PERTUSSIS 5-2.5-18.5 LF-MCG/0.5 IM SUSP: 0.5000 mL | Freq: Once | INTRAMUSCULAR | Status: DC

## 2019-09-10 MED ORDER — AMOXICILLIN-POT CLAVULANATE 875-125 MG PO TABS: 1.0000 | ORAL_TABLET | Freq: Two times a day (BID) | ORAL | 0 refills | Status: DC

## 2019-09-10 NOTE — ED Notes (Signed)
ED Provider at bedside. 

## 2019-09-10 NOTE — ED Triage Notes (Signed)
Pt arrives with cat bite to left bottom of palm/wrist about 1500 today. Denies fevers/drainage. No meds pta. Only c/o tingling sensation. sts cat is his and has only had his initial vaccinations but has had any more since he has had the cat

## 2019-09-10 NOTE — ED Provider Notes (Signed)
[facility] EMERGENCY DEPARTMENT Provider Note   CSN: [id number] Arrival date & time: 09/10/19  2052     History Chief Complaint  Patient presents with  . Animal Bite    Chad Bolton is a 24 y.o. male.  Patient presents with cat bite to the left palmar aspect of the hand.  Happened around 3:00 today.  No other injuries.  No open wounds more superficial.  Mild tenderness.  No fevers.        Past Medical History:  Diagnosis Date  . Gonorrhea    resolved (2019)    Patient Active Problem List   Diagnosis Date Noted  . Rectal ulceration 09/13/2018    Past Surgical History:  Procedure Laterality Date  . COLONOSCOPY WITH PROPOFOL N/A 09/12/2018   Procedure: COLONOSCOPY WITH PROPOFOL;  Surgeon: Laurence Spates, MD;  Location: [facility];  Service: Endoscopy;  Laterality: N/A;       No family history on file.  Social History   Tobacco Use  . Smoking status: Current Some Day Smoker  . Smokeless tobacco: Never Used  Substance Use Topics  . Alcohol use: Yes  . Drug use: Never    Home Medications Prior to Admission medications   Medication Sig Start Date End Date Taking? Authorizing Provider  amoxicillin-clavulanate (AUGMENTIN) 875-125 MG tablet Take 1 tablet by mouth 2 (two) times daily. One po bid x 7 days 09/10/19   Elnora Popowski, MD  dibucaine (NUPERCAINAL) 1 % OINT Place 1 application rectally as needed for anal irritation. 02/24/19   Margarita Mail, PA-C  doxycycline (VIBRAMYCIN) 100 MG capsule Take 1 capsule (100 mg total) by mouth 2 (two) times daily. 02/24/19   Margarita Mail, PA-C  hydrOXYzine (ATARAX/VISTARIL) 25 MG tablet Take 1 tablet (25 mg total) by mouth every 6 (six) hours. Patient not taking: Reported on 02/24/2019 11/28/18   McDonald, Mia A, PA-C  polyethylene glycol (MIRALAX / GLYCOLAX) packet Take 17 g by mouth 2 (two) times daily. Patient not taking: Reported on 02/24/2019 09/13/18   Roney Jaffe, MD  potassium chloride SA  (K-DUR,KLOR-CON) 20 MEQ tablet Take 1 tablet (20 mEq total) by mouth 2 (two) times daily for 5 days. Patient not taking: Reported on 02/24/2019 11/28/18 02/24/28  McDonald, Mia A, PA-C    Allergies    Oxycodone  Review of Systems   Review of Systems  Constitutional: Negative for chills and fever.  Gastrointestinal: Negative for abdominal pain and vomiting.  Genitourinary: Negative for flank pain.  Musculoskeletal: Negative for back pain, neck pain and neck stiffness.  Skin: Positive for wound. Negative for rash.  Neurological: Negative for headaches.    Physical Exam Updated Vital Signs BP 114/77 (BP Location: Right Arm)   Pulse 65   Temp 98 F (36.7 C) (Oral)   Resp 14   Ht 6' (1.829 m)   Wt 84.8 kg   SpO2 98%   BMI 25.36 kg/m   Physical Exam Vitals and nursing note reviewed.  Constitutional:      Appearance: He is well-developed.  HENT:     Head: Normocephalic and atraumatic.  Eyes:     General:        Right eye: No discharge.        Left eye: No discharge.  Neck:     Trachea: No tracheal deviation.  Cardiovascular:     Rate and Rhythm: Normal rate.  Pulmonary:     Effort: Pulmonary effort is normal.  Abdominal:     Tenderness: There  is no guarding.  Skin:    General: Skin is warm.     Comments: Patient has superficial laceration and abrasion to medial/proximal palmar aspect of left hand.  No gaping, no active bleeding.  Minimal tenderness.  Neurological:     Mental Status: He is alert and oriented to person, place, and time.     ED Results / Procedures / Treatments   Labs (all labs ordered are listed, but only abnormal results are displayed) Labs Reviewed - No data to display  EKG None  Radiology No results found.  Procedures Procedures (including critical care time)  Medications Ordered in ED Medications - No data to display  ED Course  I have reviewed the triage vital signs and the nursing notes.  Pertinent labs & imaging results that were  available during my care of the patient were reviewed by me and considered in my medical decision making (see chart for details).    MDM Rules/Calculators/A&P                     Patient presents with isolated cat bite.  Patient has the cat and knows the owner.  Stressed the importance of ensuring vaccines are up-to-date and if not to go to the [employer].  Patient understands importance of this and will do this tomorrow.  Augmentin and discussed reasons to return. Final Clinical Impression(s) / ED Diagnoses Final diagnoses:  Cat bite of left hand, initial encounter    Rx / DC Orders ED Discharge Orders         Ordered    amoxicillin-clavulanate (AUGMENTIN) 875-125 MG tablet  2 times daily     09/10/19 2152           Blane Ohara, MD 09/10/19 2157

## 2019-09-10 NOTE — Discharge Instructions (Signed)
Keep wound clean watch for signs of infection such as pus draining, spreading redness, fevers chills or vomiting. Take antibiotics as directed.

## 2019-12-15 ENCOUNTER — Other Ambulatory Visit: Payer: Self-pay

## 2019-12-15 ENCOUNTER — Encounter (HOSPITAL_COMMUNITY): Payer: Self-pay

## 2019-12-15 ENCOUNTER — Emergency Department (HOSPITAL_COMMUNITY)
Admission: EM | Admit: 2019-12-15 | Discharge: 2019-12-16 | Disposition: A | Discharge status: Home / Self Care | Payer: BC Managed Care – PPO | Attending: Emergency Medicine | Admitting: Emergency Medicine

## 2019-12-15 DIAGNOSIS — R198 Other specified symptoms and signs involving the digestive system and abdomen: Secondary | ICD-10-CM | POA: Diagnosis not present

## 2019-12-15 DIAGNOSIS — K6289 Other specified diseases of anus and rectum: Secondary | ICD-10-CM | POA: Insufficient documentation

## 2019-12-15 DIAGNOSIS — F17200 Nicotine dependence, unspecified, uncomplicated: Secondary | ICD-10-CM | POA: Insufficient documentation

## 2019-12-15 DIAGNOSIS — Z202 Contact with and (suspected) exposure to infections with a predominantly sexual mode of transmission: Secondary | ICD-10-CM | POA: Diagnosis not present

## 2019-12-15 DIAGNOSIS — L290 Pruritus ani: Secondary | ICD-10-CM | POA: Diagnosis present

## 2019-12-15 MED ORDER — CEFTRIAXONE SODIUM 500 MG IJ SOLR
500.0000 mg | Freq: Once | INTRAMUSCULAR | Status: AC
  Administered 2019-12-15: 500 mg via INTRAMUSCULAR
  Filled 2019-12-15: qty 500

## 2019-12-15 MED ORDER — DOXYCYCLINE HYCLATE 100 MG PO CAPS: 100.0000 mg | ORAL_CAPSULE | Freq: Two times a day (BID) | ORAL | 0 refills | Status: DC

## 2019-12-15 MED ORDER — LIDOCAINE HCL (PF) 1 % IJ SOLN
5.0000 mL | Freq: Once | INTRAMUSCULAR | Status: AC
  Administered 2019-12-15: 5 mL
  Filled 2019-12-15: qty 5

## 2019-12-15 MED ORDER — DOXYCYCLINE HYCLATE 100 MG PO TABS
100.0000 mg | ORAL_TABLET | Freq: Once | ORAL | Status: AC
  Administered 2019-12-15: 100 mg via ORAL
  Filled 2019-12-15: qty 1
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
## 2019-12-17 LAB — GC/CHLAMYDIA PROBE AMP (~~LOC~~) NOT AT ARMC
Chlamydia: NEGATIVE
Comment: NEGATIVE
Comment: NORMAL
Neisseria Gonorrhea: POSITIVE — AB

## 2020-02-10 ENCOUNTER — Emergency Department (HOSPITAL_COMMUNITY)
Admission: EM | Admit: 2020-02-10 | Discharge: 2020-02-11 | Disposition: A | Discharge status: Home / Self Care | Payer: BC Managed Care – PPO | Attending: Emergency Medicine | Admitting: Emergency Medicine

## 2020-02-10 ENCOUNTER — Encounter (HOSPITAL_COMMUNITY): Payer: Self-pay | Admitting: Emergency Medicine

## 2020-02-10 ENCOUNTER — Other Ambulatory Visit: Payer: Self-pay

## 2020-02-10 DIAGNOSIS — Z79899 Other long term (current) drug therapy: Secondary | ICD-10-CM | POA: Insufficient documentation

## 2020-02-10 DIAGNOSIS — F17210 Nicotine dependence, cigarettes, uncomplicated: Secondary | ICD-10-CM | POA: Insufficient documentation

## 2020-02-10 DIAGNOSIS — L03211 Cellulitis of face: Secondary | ICD-10-CM

## 2020-02-10 DIAGNOSIS — R519 Headache, unspecified: Secondary | ICD-10-CM | POA: Diagnosis present

## 2020-02-10 NOTE — ED Triage Notes (Signed)
Pt reports abscess on his forehead, was treated w/ medication however it has returned.  Denies fevers.

## 2020-02-11 MED ORDER — SULFAMETHOXAZOLE-TRIMETHOPRIM 800-160 MG PO TABS: 1.0000 | ORAL_TABLET | Freq: Two times a day (BID) | ORAL | 0 refills | Status: DC

## 2020-02-11 MED ORDER — SULFAMETHOXAZOLE-TRIMETHOPRIM 800-160 MG PO TABS
1.0000 | ORAL_TABLET | Freq: Once | ORAL | Status: AC
  Administered 2020-02-11: 1 via ORAL
  Filled 2020-02-11: qty 1

## 2020-02-11 NOTE — ED Notes (Signed)
ED Provider at bedside. 

## 2020-02-11 NOTE — ED Notes (Signed)
Patient verbalized understanding of dc instructions, vss, ambulatory with nad.   

## 2020-02-11 NOTE — ED Provider Notes (Signed)
[facility] EMERGENCY DEPARTMENT Provider Note   CSN: [id number] Arrival date & time: 02/10/20  2247     History Chief Complaint  Patient presents with  . Abscess    Chad Bolton is a 24 y.o. male who presents to the emergency department with a chief complaint of forehead lesion.  The patient reports that he noticed an painful, red area to his mid forehead approximately 4 days ago.  Pain is sharp, has been constant, and is rated a 7 out of 10.  He reports the pain is worse when he looks upward with his eyes.  No other known aggravating or alleviating factors.  He reports that the pain and redness have not worsens, but he has developed significant swelling extending from his hairline to his upper eyebrow on the right.  No drainage from the area.  No treatment prior to arrival.  He reports that he had a similar presentation several months ago and he was prescribed antibiotics and the lesion resolved.  He would like to have the area drained today in the ER.  He denies a history of hypertrophic scarring or keloids.  No fever, chills, periorbital erythema, edema, or warmth, sinus pain or pressure, headache, neck pain, diplopia, blurred vision, amaurosis fugax, otalgia, otorrhea, postnasal drip, numbness, weakness, slurred speech, or facial droop.  He reports no chronic medical conditions and takes no daily medications.  The history is provided by the patient and medical records. No language interpreter was used.       Past Medical History:  Diagnosis Date  . Gonorrhea    resolved (2019)    Patient Active Problem List   Diagnosis Date Noted  . Rectal ulceration 09/13/2018    Past Surgical History:  Procedure Laterality Date  . COLONOSCOPY WITH PROPOFOL N/A 09/12/2018   Procedure: COLONOSCOPY WITH PROPOFOL;  Surgeon: Carman Ching, MD;  Location: [facility];  Service: Endoscopy;  Laterality: N/A;       No family history on file.  Social History    Tobacco Use  . Smoking status: Current Some Day Smoker  . Smokeless tobacco: Never Used  Vaping Use  . Vaping Use: Never used  Substance Use Topics  . Alcohol use: Yes  . Drug use: Never    Home Medications Prior to Admission medications   Medication Sig Start Date End Date Taking? Authorizing Provider  dibucaine (NUPERCAINAL) 1 % OINT Place 1 application rectally as needed for anal irritation. 02/24/19   Arthor Captain, PA-C  doxycycline (VIBRAMYCIN) 100 MG capsule Take 1 capsule (100 mg total) by mouth 2 (two) times daily. One po bid x 7 days 12/15/19   Mesner, Barbara Cower, MD  sulfamethoxazole-trimethoprim (BACTRIM DS) 800-160 MG tablet Take 1 tablet by mouth 2 (two) times daily for 7 days. 02/11/20 02/18/20  Jguadalupe Opiela A, PA-C  potassium chloride SA (K-DUR,KLOR-CON) 20 MEQ tablet Take 1 tablet (20 mEq total) by mouth 2 (two) times daily for 5 days. Patient not taking: Reported on 02/24/2019 11/28/18 12/15/19  Yoanna Jurczyk A, PA-C    Allergies    Oxycodone  Review of Systems   Review of Systems  Constitutional: Negative for appetite change, fatigue and fever.  HENT: Negative for congestion, sinus pressure and sinus pain.   Eyes: Negative for visual disturbance.  Respiratory: Negative for shortness of breath.   Cardiovascular: Negative for chest pain.  Gastrointestinal: Negative for abdominal pain, diarrhea, nausea and vomiting.  Genitourinary: Negative for dysuria.  Musculoskeletal: Negative for back pain.  Skin:  Positive for color change and wound. Negative for rash.  Allergic/Immunologic: Negative for immunocompromised state.  Neurological: Negative for dizziness, weakness, numbness and headaches.  Psychiatric/Behavioral: Negative for confusion.    Physical Exam Updated Vital Signs BP 112/74   Pulse 75   Temp 98.3 F (36.8 C) (Oral)   Resp 15   Ht 6' (1.829 m)   Wt 86.2 kg   SpO2 99%   BMI 25.77 kg/m   Physical Exam Vitals and nursing note reviewed.   Constitutional:      Appearance: He is well-developed.  HENT:     Head: Normocephalic.      Comments: There is a 1x1 cm of erythema and warmth that is tender to palpation.  Minimal fluctuance is noted.  There is surrounding, firm, nodular area that extends from the mid forehead at the patient's hairline to the superior right eyebrow.  No induration, fluctuance, erythema, or warmth is noted to the surrounding area.  See photos below.  No periorbital involvement.    Right Ear: Tympanic membrane, ear canal and external ear normal.     Left Ear: Tympanic membrane, ear canal and external ear normal.     Nose: Nose normal. No congestion.  Eyes:     General: No scleral icterus.       Right eye: No discharge.        Left eye: No discharge.     Extraocular Movements: Extraocular movements intact.     Conjunctiva/sclera: Conjunctivae normal.     Pupils: Pupils are equal, round, and reactive to light.  Cardiovascular:     Rate and Rhythm: Normal rate and regular rhythm.     Heart sounds: No murmur heard.   Pulmonary:     Effort: Pulmonary effort is normal.  Abdominal:     General: There is no distension.     Palpations: Abdomen is soft.  Musculoskeletal:     Cervical back: Neck supple.  Skin:    General: Skin is warm and dry.  Neurological:     General: No focal deficit present.     Mental Status: He is alert.  Psychiatric:        Behavior: Behavior normal.          ED Results / Procedures / Treatments   Labs (all labs ordered are listed, but only abnormal results are displayed) Labs Reviewed - No data to display  EKG None  Radiology No results found.  Procedures Procedures (including critical care time)  Medications Ordered in ED Medications  sulfamethoxazole-trimethoprim (BACTRIM DS) 800-160 MG per tablet 1 tablet (1 tablet Oral Given 02/11/20 0358)    ED Course  I have reviewed the triage vital signs and the nursing notes.  Pertinent labs & imaging results  that were available during my care of the patient were reviewed by me and considered in my medical decision making (see chart for details).    MDM Rules/Calculators/A&P                          24 year old male with no significant past medical history presenting with a large edematous lesion to the forehead with a 1 x 1 cm area of erythema, warmth, and mild fluctuance.  No periorbital involvement, doubt septal or preseptal cellulitis.  Patient is having no sinusitis symptoms, headache, or neurologic symptoms.doubt acute invasive fungal rhinosinusitis or osteomyelitis.  The patient was seen and independently evaluated by Dr. Leonides Schanz, attending physician who did not recommend any  imaging at this time.    Vital signs are normal in the ER.  On exam, there does appear to be a small cellulitic area.  Initially, the patient is adamant for needle aspiration in the ER.  However, after much thorough education and discussion of the risk and benefits of observation and treatment with antibiotics with referral to plastic surgery or dermatology, the patient prefers to start treatment with antibiotics and follow-up in the clinic given concern for cosmetic appearance.  I also discussed that based on his presentation today that there appears to only be a very small area that may have drainage.  He is agreement with this plan at this time.  He was previously on Bactrim, which I will restart today, for the small cellulitic area.  He has requested his first dose be given in the ER, which has been ordered.  Recommended warm compresses at home.  ER return precautions given.  He is hemodynamically stable and in no acute distress.  Safe for discharge to home with outpatient follow-up as indicated.  Final Clinical Impression(s) / ED Diagnoses Final diagnoses:  Cellulitis of forehead    Rx / DC Orders ED Discharge Orders         Ordered    sulfamethoxazole-trimethoprim (BACTRIM DS) 800-160 MG tablet  2 times daily      Discontinue  Reprint     02/11/20 0343           Frederik Pear A, PA-C 02/11/20 0819    Ward, Layla Maw, DO 02/14/20 1515

## 2020-02-11 NOTE — Discharge Instructions (Signed)
Thank you for allowing me to care for you today in the Emergency Department.   You were seen today for a wound on your forehead.  Please call Dr. Kittie Plater office to schedule a follow-up appointment.  It is important to let the receptionist know that you are following up from an ER visit.  Dr. Ulice Bold is a Engineer, petroleum and can help with scar minimization. I will also refer you to a dermatologist to see if they can see you more quickly in the office.  Call to schedule a follow-up appointment.  You were given your first dose of Bactrim tonight in the ER.  Starting in the morning, take 1 tablet by mouth 2 times daily for the next week.  Apply a warm compress/warm cloth to the area for 15 to 20 minutes up to 3-4 times a day for the next 5 days to help with pain and swelling.  Take 650 mg of Tylenol or 600 mg of ibuprofen with food every 6 hours for pain.  You can alternate between these 2 medications every 3 hours if your pain returns.  For instance, you can take Tylenol at noon, followed by a dose of ibuprofen at 3, followed by second dose of Tylenol and 6.  Return to the emergency department if you develop high fevers, chills, redness and swelling around the eye, or other new, concerning symptoms.

## 2020-02-12 ENCOUNTER — Emergency Department (HOSPITAL_COMMUNITY)
Admission: EM | Admit: 2020-02-12 | Discharge: 2020-02-12 | Discharge status: Against Medical Advice | Payer: BC Managed Care – PPO

## 2020-02-12 ENCOUNTER — Ambulatory Visit (INDEPENDENT_AMBULATORY_CARE_PROVIDER_SITE_OTHER): Payer: BC Managed Care – PPO | Admitting: Plastic Surgery

## 2020-02-12 ENCOUNTER — Other Ambulatory Visit: Payer: Self-pay

## 2020-02-12 ENCOUNTER — Encounter: Payer: Self-pay | Admitting: Plastic Surgery

## 2020-02-12 DIAGNOSIS — L0201 Cutaneous abscess of face: Secondary | ICD-10-CM | POA: Diagnosis not present

## 2020-02-12 NOTE — ED Notes (Signed)
Entered ED Triage Rm1 to collect vitals, pt not in room. Looked for pt in ED bathroom and called for in ED lobby- no response. RN advised. [employer]

## 2020-02-12 NOTE — Progress Notes (Signed)
Patient ID: Chad Bolton, male    DOB: Mar 20, 1996, 24 y.o.   MRN: [id number]   Chief Complaint  Patient presents with  . Consult  . Skin Problem    The patient is a 24 year old black male here for evaluation of his face.  The patient states 4 days ago he noticed swelling of his forehead got worse.  He went to the emergency room and was given a prescription for Bactrim.  He has not gotten it filled yet sours.  He states that the swelling is getting worse glabella area periorbital area on the right.  He does not appear to have cellulitis.  Swollen and tender to touch.  He has what looks like a developing abscess on the forehead and the central area is not draining at this.  Not tried anything topical on it yet.  He is not febrile.   Review of Systems  Constitutional: Positive for activity change. Negative for appetite change.  HENT: Negative.   Eyes: Positive for pain.  Respiratory: Negative.  Negative for shortness of breath.   Cardiovascular: Negative.   Gastrointestinal: Negative.  Negative for abdominal distention.  Endocrine: Negative.   Genitourinary: Negative.   Musculoskeletal: Negative.   Skin: Positive for color change and wound.    Past Medical History:  Diagnosis Date  . Gonorrhea    resolved (2019)    Past Surgical History:  Procedure Laterality Date  . COLONOSCOPY WITH PROPOFOL N/A 09/12/2018   Procedure: COLONOSCOPY WITH PROPOFOL;  Surgeon: Carman Ching, MD;  Location: [facility];  Service: Endoscopy;  Laterality: N/A;      Current Outpatient Medications:  .  dibucaine (NUPERCAINAL) 1 % OINT, Place 1 application rectally as needed for anal irritation., Disp: 28 g, Rfl: 0 .  doxycycline (VIBRAMYCIN) 100 MG capsule, Take 1 capsule (100 mg total) by mouth 2 (two) times daily. One po bid x 7 days, Disp: 14 capsule, Rfl: 0 .  sulfamethoxazole-trimethoprim (BACTRIM DS) 800-160 MG tablet, Take 1 tablet by mouth 2 (two) times daily for 7 days., Disp: 14 tablet,  Rfl: 0   Objective:   Vitals:   02/12/20 1015  BP: 118/72  Pulse: 76  Temp: 98.4 F (36.9 C)  SpO2: 95%    Physical Exam Vitals and nursing note reviewed.  HENT:     Head: Normocephalic.   Cardiovascular:     Rate and Rhythm: Normal rate.     Pulses: Normal pulses.  Pulmonary:     Effort: Pulmonary effort is normal.  Abdominal:     General: Abdomen is flat. There is no distension.     Tenderness: There is no abdominal tenderness.  Skin:    General: Skin is warm.     Findings: Erythema present.  Neurological:     General: No focal deficit present.     Mental Status: He is alert and oriented to person, place, and time.  Psychiatric:        Mood and Affect: Mood normal.        Behavior: Behavior normal.        Thought Content: Thought content normal.     Assessment & Plan:  Abscess of face  I explained my concern with excising the area at this time.  It would result in a scar.  I recommend warm compress to the area.  This may open the pore and allow the area to drain.  He can take motrin 200 mg every 4-6 hrs. I also highly recommend that  he fill his Bactrim prescription immediately and start taking the medication. I would like to see him back in 1 week.  If it gets any worse he will need to go back to the emergency room.  Alena Bills Ilijah Doucet, DO

## 2020-02-13 ENCOUNTER — Encounter (HOSPITAL_COMMUNITY): Payer: Self-pay | Admitting: Emergency Medicine

## 2020-02-13 ENCOUNTER — Emergency Department (HOSPITAL_COMMUNITY)
Admission: EM | Admit: 2020-02-13 | Discharge: 2020-02-13 | Disposition: A | Discharge status: Home / Self Care | Payer: BC Managed Care – PPO | Source: Home / Self Care | Attending: Emergency Medicine | Admitting: Emergency Medicine

## 2020-02-13 ENCOUNTER — Encounter (HOSPITAL_COMMUNITY): Payer: Self-pay

## 2020-02-13 ENCOUNTER — Emergency Department (HOSPITAL_COMMUNITY)
Admission: EM | Admit: 2020-02-13 | Discharge: 2020-02-13 | Disposition: A | Discharge status: Home / Self Care | Payer: BC Managed Care – PPO | Attending: Emergency Medicine | Admitting: Emergency Medicine

## 2020-02-13 ENCOUNTER — Other Ambulatory Visit: Payer: Self-pay

## 2020-02-13 DIAGNOSIS — F17200 Nicotine dependence, unspecified, uncomplicated: Secondary | ICD-10-CM | POA: Insufficient documentation

## 2020-02-13 DIAGNOSIS — Z5321 Procedure and treatment not carried out due to patient leaving prior to being seen by health care provider: Secondary | ICD-10-CM | POA: Diagnosis not present

## 2020-02-13 DIAGNOSIS — R220 Localized swelling, mass and lump, head: Secondary | ICD-10-CM | POA: Diagnosis present

## 2020-02-13 DIAGNOSIS — L0291 Cutaneous abscess, unspecified: Secondary | ICD-10-CM

## 2020-02-13 DIAGNOSIS — B9689 Other specified bacterial agents as the cause of diseases classified elsewhere: Secondary | ICD-10-CM | POA: Insufficient documentation

## 2020-02-13 DIAGNOSIS — L0201 Cutaneous abscess of face: Secondary | ICD-10-CM | POA: Insufficient documentation

## 2020-02-13 MED ORDER — DOXYCYCLINE HYCLATE 100 MG PO CAPS: 100.0000 mg | ORAL_CAPSULE | Freq: Two times a day (BID) | ORAL | 0 refills | Status: DC

## 2020-02-13 NOTE — Discharge Instructions (Signed)
Contact a health care provider if you have:  More redness, swelling, or pain around your abscess.  More fluid or blood coming from your abscess.  Warm skin around your abscess.  More pus or a bad smell coming from your abscess.  A fever.  Muscle aches.  Chills or a general ill feeling.  Get help right away if you:  Have severe pain.  See red streaks on your skin spreading away from the abscess.

## 2020-02-13 NOTE — ED Provider Notes (Signed)
[facility]-EMERGENCY DEPT Provider Note   CSN: [id number] Arrival date & time: 02/13/20  1231     History Chief Complaint  Patient presents with  . Facial Swelling    Chad Bolton is a 24 y.o. male who presents with facial swelling.  He was seen 3 days ago for the same complaint, started on Bactrim, followed up with plastic surgery yesterday and was controlled to continue with antibiotics and come back for recheck in 1 week.  Patient is concerned because he has noticed swelling below the right thigh and has some nasal congestion on that side.  He has what appears to be a cystic acne or inflamed cyst of the right forehead.  He states that he has been using warm compresses.  He feels like because his face is swollen it is getting worse and he is having trouble sleeping because he cannot breathe through his nose.  He has not tried anything for nasal congestion.  He denies fevers, chills, facial pain.  He states "I cannot go to school looking like this."  He denies changes in vision.  HPI     Past Medical History:  Diagnosis Date  . Gonorrhea    resolved (2019)    Patient Active Problem List   Diagnosis Date Noted  . Abscess of face 02/12/2020  . Rectal ulceration 09/13/2018    Past Surgical History:  Procedure Laterality Date  . COLONOSCOPY WITH PROPOFOL N/A 09/12/2018   Procedure: COLONOSCOPY WITH PROPOFOL;  Surgeon: Carman Ching, MD;  Location: [facility];  Service: Endoscopy;  Laterality: N/A;       No family history on file.  Social History   Tobacco Use  . Smoking status: Current Some Day Smoker  . Smokeless tobacco: Never Used  Vaping Use  . Vaping Use: Never used  Substance Use Topics  . Alcohol use: Yes  . Drug use: Never    Home Medications Prior to Admission medications   Medication Sig Start Date End Date Taking? Authorizing Provider  dibucaine (NUPERCAINAL) 1 % OINT Place 1 application rectally as needed for anal irritation.  02/24/19   Arthor Captain, PA-C  doxycycline (VIBRAMYCIN) 100 MG capsule Take 1 capsule (100 mg total) by mouth 2 (two) times daily. One po bid x 7 days 12/15/19   Mesner, Barbara Cower, MD  sulfamethoxazole-trimethoprim (BACTRIM DS) 800-160 MG tablet Take 1 tablet by mouth 2 (two) times daily for 7 days. 02/11/20 02/18/20  McDonald, Mia A, PA-C  potassium chloride SA (K-DUR,KLOR-CON) 20 MEQ tablet Take 1 tablet (20 mEq total) by mouth 2 (two) times daily for 5 days. Patient not taking: Reported on 02/24/2019 11/28/18 12/15/19  McDonald, Mia A, PA-C    Allergies    Oxycodone  Review of Systems   Review of Systems  Constitutional: Negative for chills and fever.  HENT: Positive for congestion and facial swelling. Negative for dental problem, sinus pain and sore throat.   Skin:       abscess    Physical Exam Updated Vital Signs BP 135/78 (BP Location: Left Arm)   Pulse 84   Temp 97.7 F (36.5 C) (Oral)   Resp 16   Ht 6' (1.829 m)   Wt 88 kg   SpO2 98%   BMI 26.31 kg/m   Physical Exam Vitals and nursing note reviewed.  Constitutional:      General: He is not in acute distress.    Appearance: He is well-developed. He is not diaphoretic.  HENT:  Head: Normocephalic and atraumatic.     Comments: Small, swollen pinodule about 1 cm in circumference and about 1/2 cm in height, central crusting, no erythema minimally tender to palpation, no surrounding induration, no severe central fluctuance.  Minimal right-sided facial swelling just below the right eye as compared to the left.  No obvious swelling of the turbinates. Eyes:     General: No scleral icterus.    Conjunctiva/sclera: Conjunctivae normal.  Cardiovascular:     Rate and Rhythm: Normal rate and regular rhythm.     Heart sounds: Normal heart sounds.  Pulmonary:     Effort: Pulmonary effort is normal. No respiratory distress.     Breath sounds: Normal breath sounds.  Abdominal:     Palpations: Abdomen is soft.     Tenderness: There is  no abdominal tenderness.  Musculoskeletal:     Cervical back: Normal range of motion and neck supple.  Skin:    General: Skin is warm and dry.  Neurological:     Mental Status: He is alert.  Psychiatric:        Mood and Affect: Mood is anxious.        Behavior: Behavior normal.     ED Results / Procedures / Treatments   Labs (all labs ordered are listed, but only abnormal results are displayed) Labs Reviewed - No data to display  EKG None  Radiology No results found.  Procedures Procedures (including critical care time)  Medications Ordered in ED Medications - No data to display  ED Course  I have reviewed the triage vital signs and the nursing notes.  Pertinent labs & imaging results that were available during my care of the patient were reviewed by me and considered in my medical decision making (see chart for details).    MDM Rules/Calculators/A&P                          Patient with mild facial swelling.  Swelling under the right eye is nontender.  Swelling appears to be dependent.  Advised to continue with antibiotics and will switch to doxycycline.  He is also advised to follow with the plastic surgeon.  Continue with warm compresses.  I did my best to reassure this very anxious patient that his abscess was rather mild and should improve without any significant issue.  He does not appear to have any deep space infections including orbital cellulitis, severe sinus infection.  He appears appropriate for discharge and I discussed return precautions. Final Clinical Impression(s) / ED Diagnoses Final diagnoses:  None    Rx / DC Orders ED Discharge Orders    None       Arthor Captain, PA-C 02/15/20 1017    Linwood Dibbles, MD 02/18/20 1159

## 2020-02-13 NOTE — ED Triage Notes (Signed)
Pt has abscess on his forehead, seen here Sunday and put on antibiotics, saw plastics today and told they didn't want to drain it either due to scarring.

## 2020-02-13 NOTE — ED Triage Notes (Signed)
Patient has facial swelling, appears to be D/T an abscess on his R forehead, endorses that his R sinus is now clogged and he is worried about his breathing. No apparent SOB, well-appearing.

## 2020-02-13 NOTE — ED Notes (Signed)
Pt stated that he was leaving and gave NT his stickers.

## 2020-02-19 ENCOUNTER — Ambulatory Visit: Payer: BC Managed Care – PPO | Admitting: Plastic Surgery

## 2020-03-13 ENCOUNTER — Ambulatory Visit: Payer: BC Managed Care – PPO | Attending: Internal Medicine

## 2020-03-13 DIAGNOSIS — Z23 Encounter for immunization: Secondary | ICD-10-CM

## 2020-03-13 NOTE — Progress Notes (Signed)
   Covid-19 Vaccination Clinic  Name:  Keyandre Pileggi    MRN: [id number] DOB: February 23, 1996  03/13/2020  Mr. Cullinane was observed post Covid-19 immunization for 15 minutes without incident. He was provided with Vaccine Information Sheet and instruction to access the V-Safe system.   Mr. Littles was instructed to call 911 with any severe reactions post vaccine: Marland Kitchen Difficulty breathing  . Swelling of face and throat  . A fast heartbeat  . A bad rash all over body  . Dizziness and weakness   Immunizations Administered    Name Date Dose VIS Date Route   JANSSEN COVID-19 VACCINE 03/13/2020  1:06 PM 0.5 mL 10/13/2019 Intramuscular   Manufacturer: [employer]   Lot: [id number]   NDC: 86754-492-01

## 2020-04-12 IMAGING — CT CT PELVIS WITH CONTRAST
2 of 3 series · 17 of 46 positions shown, 19 images · IV contrast (omnipaque)
Comparison: CT 09/10/2018

CLINICAL DATA: 22-year-old with rectal pain.  Evaluate for abscess.

EXAM:
CT PELVIS WITH CONTRAST
TECHNIQUE: Multidetector CT imaging of the pelvis was performed using the
standard protocol following the bolus administration of intravenous
contrast.
CONTRAST:  100mL OMNIPAQUE IOHEXOL 300 MG/ML  SOLN

[Series 3: pelvis with 5.0 · axial · 0.75mm/px · z∈[+766,+1066]mm · 14 of 70 slices shown, 16 images]
[im 5/70  soft-tissue]
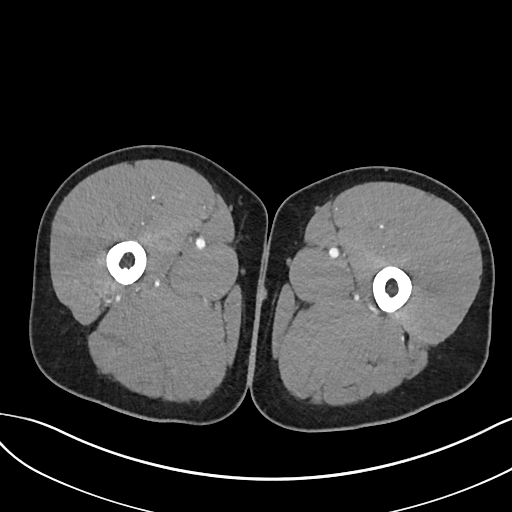
[im 5/70  bone]
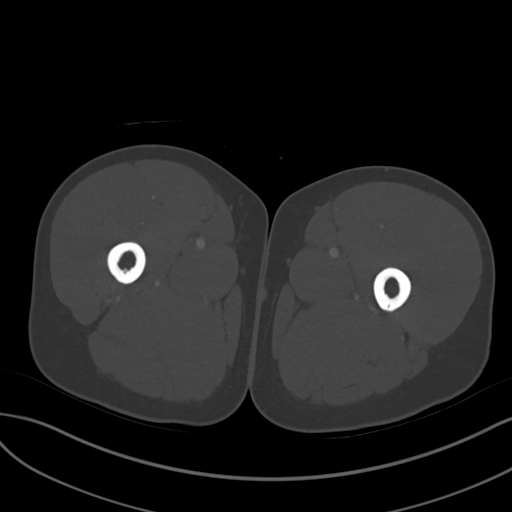
[im 9/70  soft-tissue]
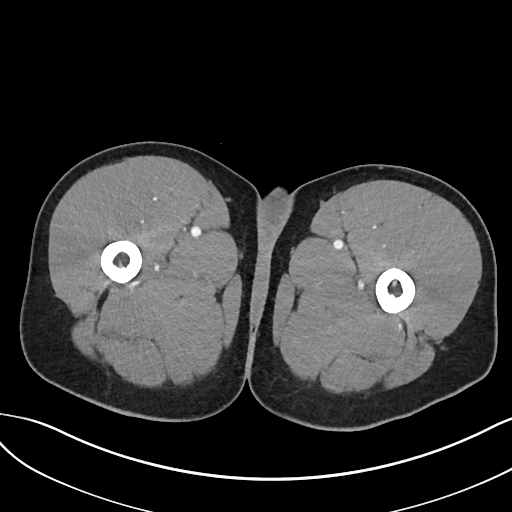
[im 14/70  soft-tissue]
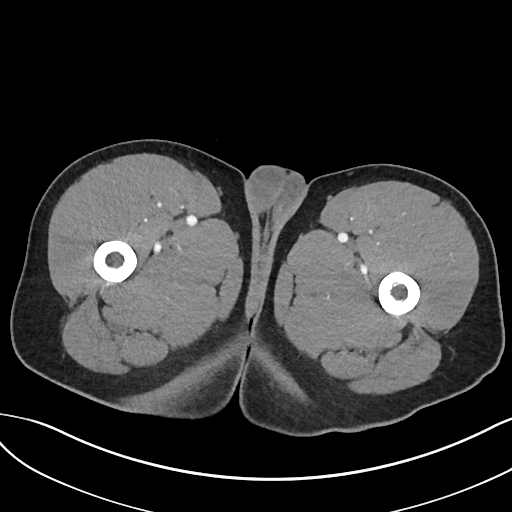
[im 18/70  soft-tissue]
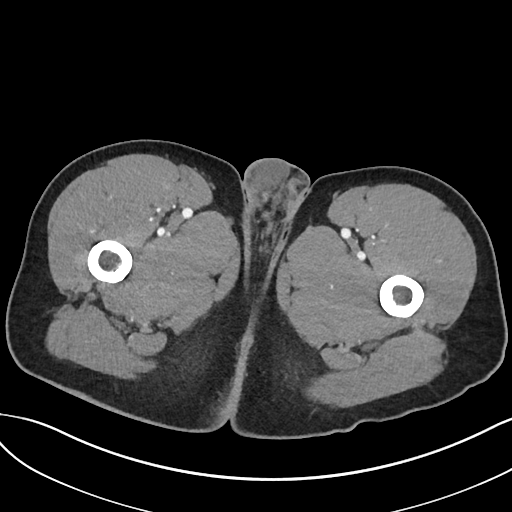
[im 23/70  soft-tissue]
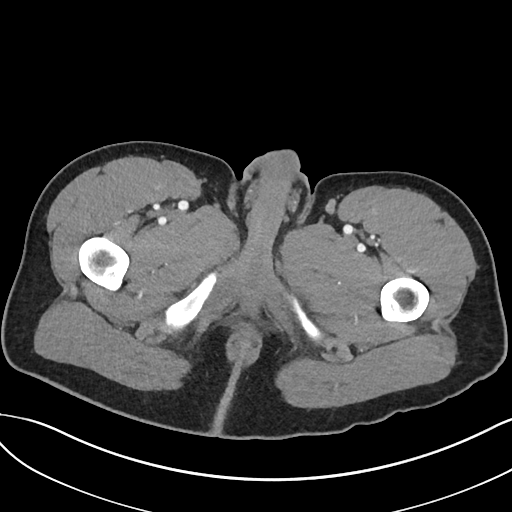
[im 27/70  soft-tissue]
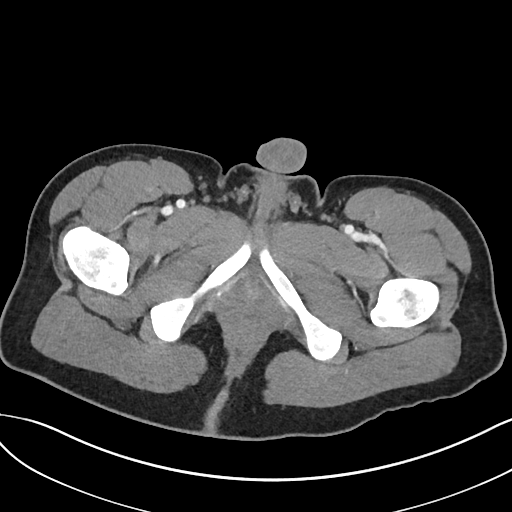
[im 32/70  soft-tissue]
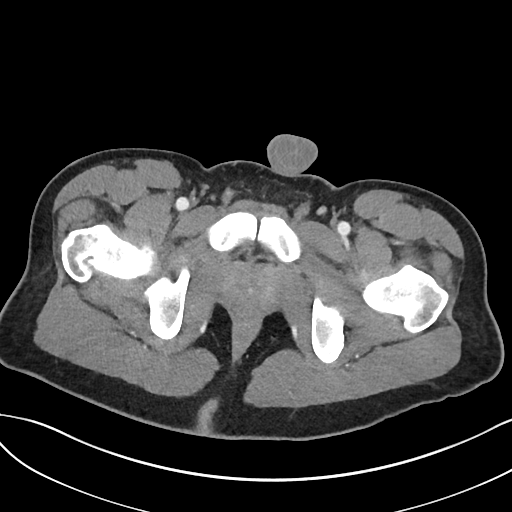
[im 38/70  soft-tissue]
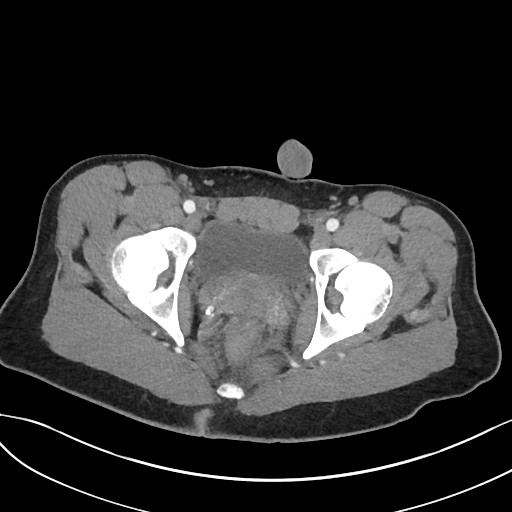
[im 43/70  soft-tissue]
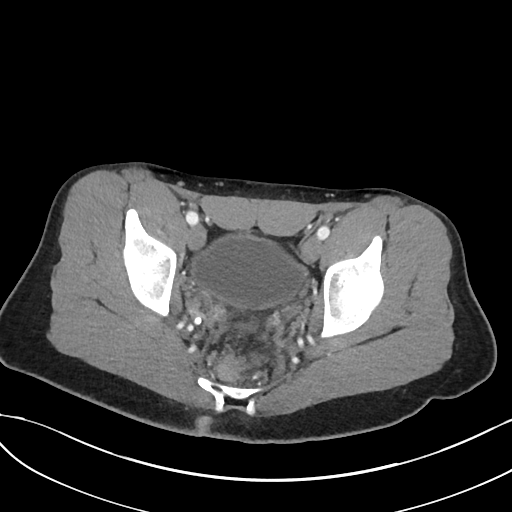
[im 43/70  bone]
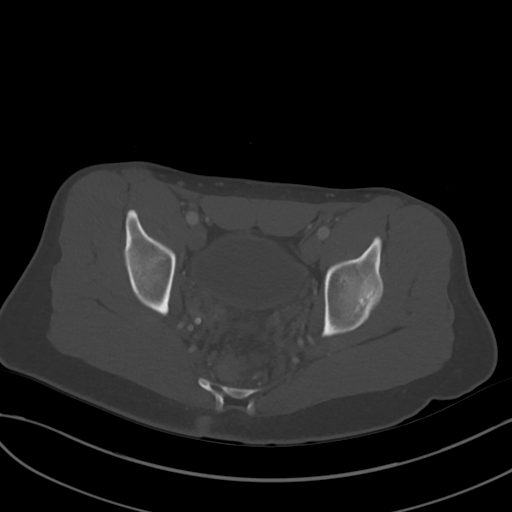
[im 47/70  soft-tissue]
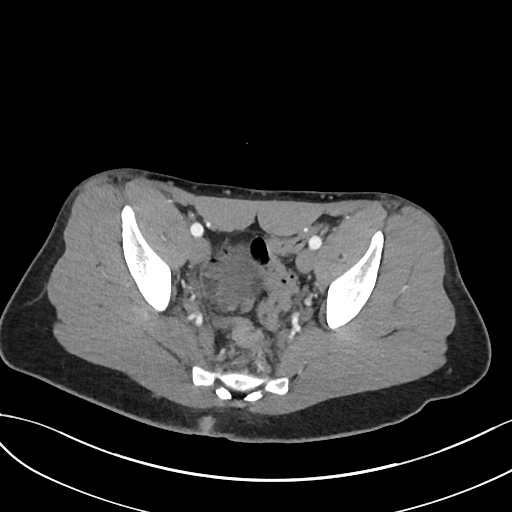
[im 52/70  soft-tissue]
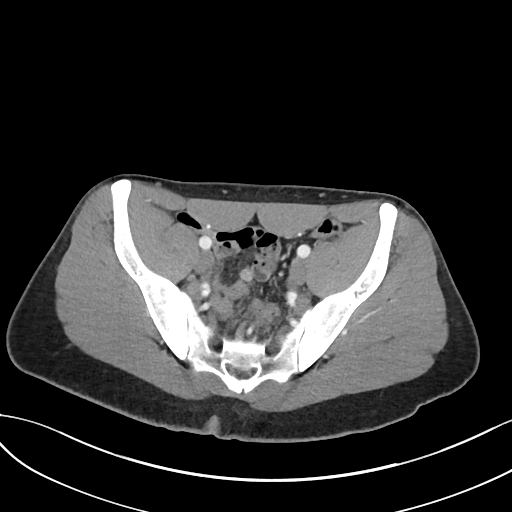
[im 56/70  soft-tissue]
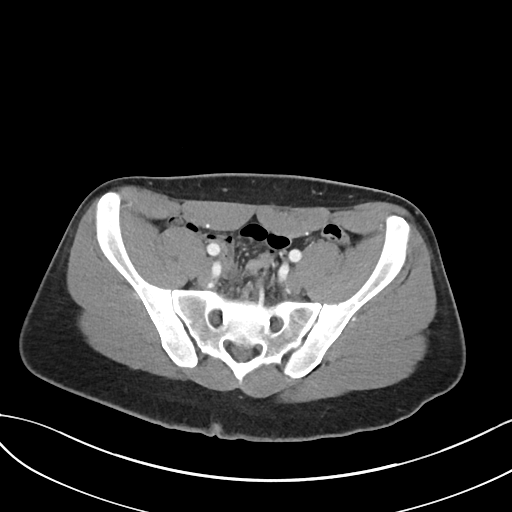
[im 61/70  soft-tissue]
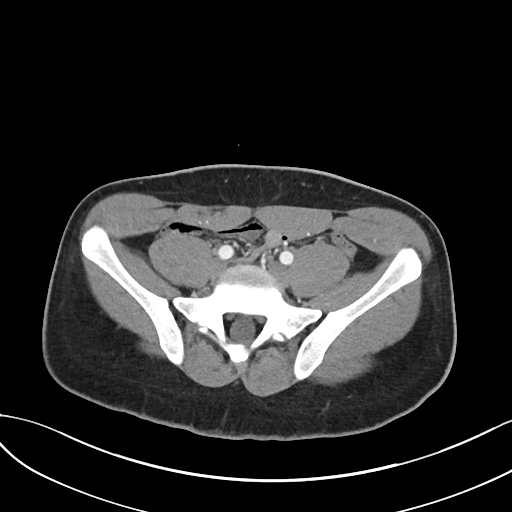
[im 65/70  soft-tissue]
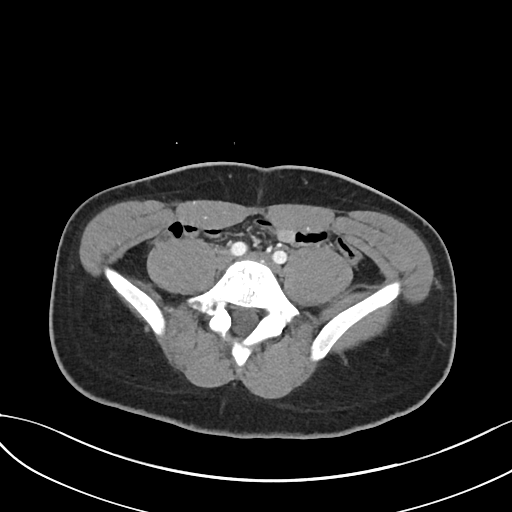

[Series 5: pelvis with 2.0 cor · coronal · 0.69mm/px · 3 of 151 slices shown]
[im 67/151  soft-tissue]
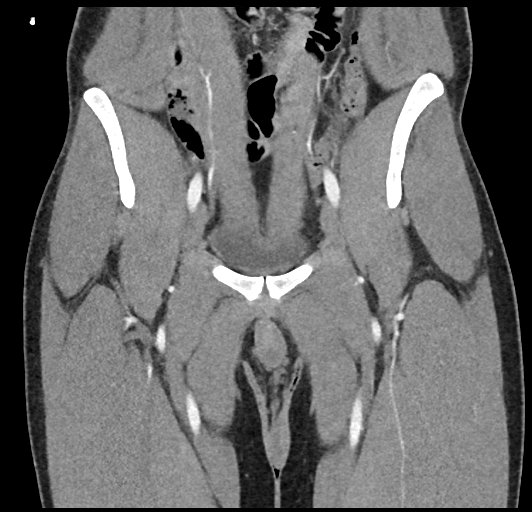
[im 84/151  soft-tissue]
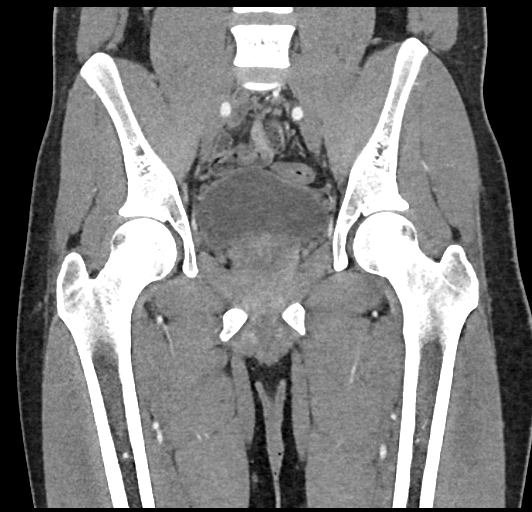
[im 101/151  soft-tissue]
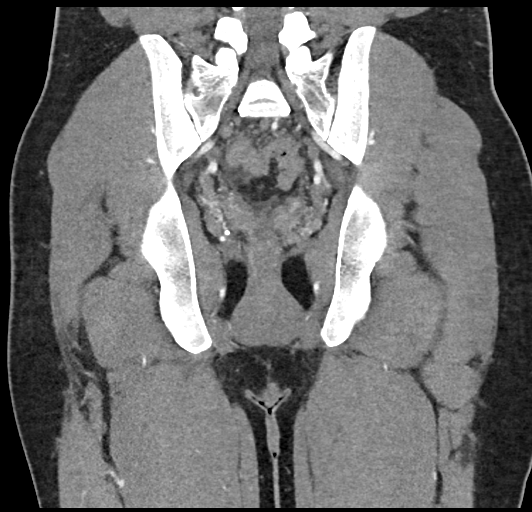

[17 of 46 positions shown; findings below may reference images not displayed]

FINDINGS: Urinary Tract: Normal appearance of the urinary bladder with a small
amount of fluid.

Bowel: Normal caliber the rectum. Visualized small bowel structures
are unremarkable. No focal abnormality in the perianal region.

Vascular/Lymphatic: Vascular structures are unremarkable. Small
lymph nodes in the inguinal regions are within normal limits. No
significant pelvic lymphadenopathy.

Reproductive:  Prostate is unremarkable.

Other:  Trace free fluid in the pelvis.

x

Musculoskeletal: Right pars defect at L5. Negative for
anterolisthesis. Pars defect is chronic. The perianal soft tissues
are within normal limits. No evidence for a perianal abscess.
Minimal subcutaneous edema involving the medial right buttock on
sequence 3, image 53. No underlying fluid collection or abscess.
IMPRESSION: 1. Minimal subcutaneous edema or stranding involving the medial
right buttock. No underlying fluid or abscess.
2. Trace free fluid in the pelvis is nonspecific. Findings could be
related to underlying inflammatory process.

## 2020-09-09 ENCOUNTER — Telehealth: Payer: Self-pay | Admitting: Internal Medicine

## 2020-09-09 NOTE — Telephone Encounter (Signed)
Good afternoon Dr. Rhea Belton, this patient called wanting to transfer his care to [place]. Has seen a GI specialist within last year at [facility], but is not happy with his care there.  Records are in epic.  Can you please review and advise and scheduling?  Thank you.

## 2020-10-29 ENCOUNTER — Other Ambulatory Visit: Payer: Self-pay

## 2020-10-29 ENCOUNTER — Encounter (HOSPITAL_COMMUNITY): Payer: Self-pay | Admitting: Emergency Medicine

## 2020-10-29 ENCOUNTER — Emergency Department (HOSPITAL_COMMUNITY)
Admission: EM | Admit: 2020-10-29 | Discharge: 2020-10-30 | Disposition: A | Discharge status: Home / Self Care | Payer: BC Managed Care – PPO | Attending: Emergency Medicine | Admitting: Emergency Medicine

## 2020-10-29 DIAGNOSIS — R0789 Other chest pain: Secondary | ICD-10-CM

## 2020-10-29 DIAGNOSIS — R0602 Shortness of breath: Secondary | ICD-10-CM | POA: Diagnosis not present

## 2020-10-29 DIAGNOSIS — R0982 Postnasal drip: Secondary | ICD-10-CM | POA: Insufficient documentation

## 2020-10-29 DIAGNOSIS — F17200 Nicotine dependence, unspecified, uncomplicated: Secondary | ICD-10-CM | POA: Insufficient documentation

## 2020-10-29 DIAGNOSIS — E876 Hypokalemia: Secondary | ICD-10-CM | POA: Diagnosis not present

## 2020-10-29 DIAGNOSIS — R0981 Nasal congestion: Secondary | ICD-10-CM | POA: Insufficient documentation

## 2020-10-29 DIAGNOSIS — Z20822 Contact with and (suspected) exposure to covid-19: Secondary | ICD-10-CM | POA: Insufficient documentation

## 2020-10-29 DIAGNOSIS — F419 Anxiety disorder, unspecified: Secondary | ICD-10-CM | POA: Insufficient documentation

## 2020-10-29 DIAGNOSIS — R059 Cough, unspecified: Secondary | ICD-10-CM | POA: Diagnosis not present

## 2020-10-29 NOTE — ED Triage Notes (Signed)
Pt c/o intermittent left sided chest pain and shortness of breath x 3 days, worsening tonight.

## 2020-10-30 ENCOUNTER — Emergency Department (HOSPITAL_COMMUNITY): Payer: BC Managed Care – PPO

## 2020-10-30 LAB — CBC
HCT: 46.6 % (ref 39.0–52.0)
Hemoglobin: 16.7 g/dL (ref 13.0–17.0)
MCH: 30.3 pg (ref 26.0–34.0)
MCHC: 35.8 g/dL (ref 30.0–36.0)
MCV: 84.4 fL (ref 80.0–100.0)
Platelets: 264 10*3/uL (ref 150–400)
RBC: 5.52 MIL/uL (ref 4.22–5.81)
RDW: 12.3 % (ref 11.5–15.5)
WBC: 9.2 10*3/uL (ref 4.0–10.5)
nRBC: 0 % (ref 0.0–0.2)

## 2020-10-30 LAB — BASIC METABOLIC PANEL
Anion gap: 7 (ref 5–15)
BUN: 20 mg/dL (ref 6–20)
CO2: 27 mmol/L (ref 22–32)
Calcium: 9.2 mg/dL (ref 8.9–10.3)
Chloride: 102 mmol/L (ref 98–111)
Creatinine, Ser: 1.01 mg/dL (ref 0.61–1.24)
GFR, Estimated: >60 mL/min (ref >60)
Glucose, Bld: 72 mg/dL (ref 70–99)
Potassium: 3.3 mmol/L — ABNORMAL LOW (ref 3.5–5.1)
Sodium: 136 mmol/L (ref 135–145)

## 2020-10-30 LAB — TROPONIN I (HIGH SENSITIVITY)
Troponin I (High Sensitivity): <2 ng/L (ref <18)
Troponin I (High Sensitivity): <2 ng/L (ref <18)

## 2020-10-30 LAB — SARS CORONAVIRUS 2 (TAT 6-24 HRS): SARS Coronavirus 2: NEGATIVE

## 2020-10-30 MED ORDER — NAPROXEN 500 MG PO TABS: 500.0000 mg | ORAL_TABLET | Freq: Two times a day (BID) | ORAL | 0 refills | Status: AC

## 2020-10-30 MED ORDER — AEROCHAMBER PLUS FLO-VU LARGE MISC
1.0000 | Freq: Once | Status: AC
  Administered 2020-10-30: 1

## 2020-10-30 MED ORDER — ALBUTEROL SULFATE HFA 108 (90 BASE) MCG/ACT IN AERS
2.0000 | INHALATION_SPRAY | RESPIRATORY_TRACT | Status: DC | PRN
  Administered 2020-10-30: 2 via RESPIRATORY_TRACT
  Filled 2020-10-30: qty 6.7

## 2020-10-30 MED ORDER — POTASSIUM CHLORIDE CRYS ER 20 MEQ PO TBCR
40.0000 meq | EXTENDED_RELEASE_TABLET | Freq: Once | ORAL | Status: AC
  Administered 2020-10-30: 40 meq via ORAL
  Filled 2020-10-30: qty 2

## 2020-10-30 NOTE — Discharge Instructions (Addendum)
1. Medications: Naproxen, albuterol usual home medications 2. Treatment: rest, drink plenty of fluids,  3. Follow Up: Please followup with your primary doctor in 2 days for discussion of your diagnoses and further evaluation after today's visit; if you do not have a primary care doctor use the resource guide provided to find one; Please return to the ER for chest pain that worsens with exertion, worsening difficulty breathing, high fevers or other concerns.  Always take naproxen and fluoxetine with food.  If you notice dark or tarry stools please stop naproxyn.

## 2020-10-30 NOTE — ED Provider Notes (Signed)
[facility] EMERGENCY DEPARTMENT Provider Note   CSN: [id number] Arrival date & time: 10/29/20  2349     History Chief Complaint  Patient presents with  . Chest Pain    Chad Bolton is a 25 y.o. male presents to the Emergency Department complaining of intermittent central chest pain onset 3 days ago.  Pt with associated cough and occasional shortness of breath.  Pt reports nasal congestion 1 week ago with postnasal drip but no fever, chills, headache, neck pain.  Pt denies sick contacts.  He has has 1 shot of the J&J COVID vaccine. Pt reports associated anxiety about his chest pain.  No treatments PTA.  Pt reports that laying flat makes his chest feel tighter but nothing makes it better. Denies hx of reactive airway disease.    The history is provided by the patient and medical records. No language interpreter was used.       Past Medical History:  Diagnosis Date  . Gonorrhea    resolved (2019)    Patient Active Problem List   Diagnosis Date Noted  . Abscess of face 02/12/2020  . Rectal ulceration 09/13/2018    Past Surgical History:  Procedure Laterality Date  . COLONOSCOPY WITH PROPOFOL N/A 09/12/2018   Procedure: COLONOSCOPY WITH PROPOFOL;  Surgeon: Carman Ching, MD;  Location: [facility];  Service: Endoscopy;  Laterality: N/A;       No family history on file.  Social History   Tobacco Use  . Smoking status: Current Some Day Smoker  . Smokeless tobacco: Never Used  Vaping Use  . Vaping Use: Never used  Substance Use Topics  . Alcohol use: Yes  . Drug use: Never    Home Medications Prior to Admission medications   Medication Sig Start Date End Date Taking? Authorizing Provider  ADDERALL XR 15 MG 24 hr capsule Take 15 mg by mouth every morning. 10/22/20  Yes [provider]  Bacillus Coagulans-Inulin (PROBIOTIC FORMULA PO) Take 1 capsule by mouth daily.   Yes [provider]  FLUoxetine (PROZAC) 10 MG capsule Take 10  mg by mouth every morning. 10/10/20  Yes [provider]  naproxen (NAPROSYN) 500 MG tablet Take 1 tablet (500 mg total) by mouth 2 (two) times daily with a meal. 10/30/20  Yes Muthersbaugh, Dahlia Client, PA-C  traZODone (DESYREL) 100 MG tablet Take 100 mg by mouth at bedtime. 10/22/20  Yes [provider]  potassium chloride SA (K-DUR,KLOR-CON) 20 MEQ tablet Take 1 tablet (20 mEq total) by mouth 2 (two) times daily for 5 days. Patient not taking: Reported on 02/24/2019 11/28/18 12/15/19  McDonald, Mia A, PA-C    Allergies    Oxycodone and Buspirone  Review of Systems   Review of Systems  Constitutional: Negative for appetite change, diaphoresis, fatigue, fever and unexpected weight change.  HENT: Negative for mouth sores.   Eyes: Negative for visual disturbance.  Respiratory: Positive for cough and shortness of breath. Negative for chest tightness and wheezing.   Cardiovascular: Positive for chest pain.  Gastrointestinal: Negative for abdominal pain, constipation, diarrhea, nausea and vomiting.  Endocrine: Negative for polydipsia, polyphagia and polyuria.  Genitourinary: Negative for dysuria, frequency, hematuria and urgency.  Musculoskeletal: Negative for back pain and neck stiffness.  Skin: Negative for rash.  Allergic/Immunologic: Negative for immunocompromised state.  Neurological: Negative for syncope, light-headedness and headaches.  Hematological: Does not bruise/bleed easily.  Psychiatric/Behavioral: Negative for sleep disturbance. The patient is not nervous/anxious.     Physical Exam Updated Vital  Signs BP 116/82   Pulse 61   Temp 98.7 F (37.1 C) (Oral)   Resp 16   SpO2 100%   Physical Exam Vitals and nursing note reviewed.  Constitutional:      General: He is not in acute distress.    Appearance: He is not diaphoretic.  HENT:     Head: Normocephalic.  Eyes:     General: No scleral icterus.    Conjunctiva/sclera: Conjunctivae normal.  Cardiovascular:      Rate and Rhythm: Normal rate and regular rhythm.     Pulses: Normal pulses.          Radial pulses are 2+ on the right side and 2+ on the left side.     Heart sounds: Normal heart sounds.  Pulmonary:     Effort: Pulmonary effort is normal. No tachypnea, accessory muscle usage, prolonged expiration, respiratory distress or retractions.     Breath sounds: No stridor. Decreased breath sounds (throughout) present.     Comments: Equal chest rise. No increased work of breathing. Abdominal:     General: There is no distension.     Palpations: Abdomen is soft.     Tenderness: There is no abdominal tenderness. There is no guarding or rebound.  Musculoskeletal:     Cervical back: Normal range of motion.     Right lower leg: No tenderness. No edema.     Left lower leg: No tenderness. No edema.     Comments: Moves all extremities equally and without difficulty.  Skin:    General: Skin is warm and dry.     Capillary Refill: Capillary refill takes less than 2 seconds.  Neurological:     Mental Status: He is alert.     GCS: GCS eye subscore is 4. GCS verbal subscore is 5. GCS motor subscore is 6.     Comments: Speech is clear and goal oriented.  Psychiatric:        Mood and Affect: Mood normal.     ED Results / Procedures / Treatments   Labs (all labs ordered are listed, but only abnormal results are displayed) Labs Reviewed  BASIC METABOLIC PANEL - Abnormal; Notable for the following components:      Result Value   Potassium 3.3 (*)    All other components within normal limits  SARS CORONAVIRUS 2 (TAT 6-24 HRS)  CBC  TROPONIN I (HIGH SENSITIVITY)  TROPONIN I (HIGH SENSITIVITY)    EKG EKG Interpretation  Date/Time:  Thursday October 30 2020 05:00:49 EDT Ventricular Rate:  61 PR Interval:  168 QRS Duration: 80 QT Interval:  368 QTC Calculation: 371 R Axis:   81 Text Interpretation: Sinus rhythm ST elev, probable normal early repol pattern Confirmed by Zadie Rhine ([id number]) on  10/30/2020 5:36:23 AM   Radiology DG Chest 2 View  Result Date: 10/30/2020 CLINICAL DATA:  Intermittent left-sided chest pain and shortness of breath for 3 days, worsening tonight EXAM: CHEST - 2 VIEW COMPARISON:  11/28/2018 FINDINGS: The heart size and mediastinal contours are within normal limits. Both lungs are clear. The visualized skeletal structures are unremarkable. IMPRESSION: No active cardiopulmonary disease. Electronically Signed   By: Sharlet Salina M.D.   On: 10/30/2020 00:13    Procedures Procedures   Medications Ordered in ED Medications  albuterol (VENTOLIN HFA) 108 (90 Base) MCG/ACT inhaler 2 puff (2 puffs Inhalation Given 10/30/20 0508)  potassium chloride SA (KLOR-CON) CR tablet 40 mEq (has no administration in time range)  AeroChamber Plus Flo-Vu Large  MISC 1 each (1 each Other Given 10/30/20 0518)    ED Course  I have reviewed the triage vital signs and the nursing notes.  Pertinent labs & imaging results that were available during my care of the patient were reviewed by me and considered in my medical decision making (see chart for details).    MDM Rules/Calculators/A&P                           Patient presents for intermittent chest pain for the last several days.  He reports its been constant for 6 to 10 hours prior to arrival in the emergency department.  Labs are reassuring.  No leukocytosis or anemia.  Troponin negative.  Chest x-ray without evidence of pneumonia, pneumothorax, pulmonary edema, widened mediastinum or Boerhaave's.  Patient denies trauma.  EKG without ischemia.  Mild hypokalemia replaced here in the emergency department.  Patient request testing for Covid which was completed.  Results are pending.  Vital signs are within normal limits.  No hypoxia, hypotension, tachycardia or fever.  No risk factors for pulmonary embolism and no peripheral edema.  No tachycardia.  No increased work of breathing.  Slightly diminished breath sounds on exam.  Patient  given albuterol with improvement in breath sounds and some improvement in chest tightness.  Suspect viral etiology.  Patient will be discharged home with albuterol.  Strict return precautions and close follow-up with primary care given.  Patient states understanding and is in agreement with the plan.   Final Clinical Impression(s) / ED Diagnoses Final diagnoses:  Atypical chest pain  Hypokalemia    Rx / DC Orders ED Discharge Orders         Ordered    naproxen (NAPROSYN) 500 MG tablet  2 times daily with meals        10/30/20 0558           Muthersbaugh, Dahlia Client, PA-C 10/30/20 0932    Zadie Rhine, MD 10/30/20 [phone]

## 2020-11-28 ENCOUNTER — Encounter (HOSPITAL_COMMUNITY): Payer: Self-pay

## 2020-11-28 ENCOUNTER — Ambulatory Visit (HOSPITAL_COMMUNITY)
Admission: EM | Admit: 2020-11-28 | Discharge: 2020-11-28 | Disposition: A | Discharge status: Home / Self Care | Payer: BC Managed Care – PPO | Attending: Medical Oncology | Admitting: Medical Oncology

## 2020-11-28 ENCOUNTER — Other Ambulatory Visit: Payer: Self-pay

## 2020-11-28 DIAGNOSIS — R300 Dysuria: Secondary | ICD-10-CM | POA: Diagnosis present

## 2020-11-28 DIAGNOSIS — J029 Acute pharyngitis, unspecified: Secondary | ICD-10-CM

## 2020-11-28 DIAGNOSIS — R198 Other specified symptoms and signs involving the digestive system and abdomen: Secondary | ICD-10-CM | POA: Diagnosis not present

## 2020-11-28 LAB — HIV ANTIBODY (ROUTINE TESTING W REFLEX): HIV Screen 4th Generation wRfx: NONREACTIVE

## 2020-11-28 NOTE — ED Provider Notes (Signed)
[facility]    CSN: [id number] Arrival date & time: 11/28/20  1147      History   Chief Complaint Chief Complaint  Patient presents with  . Throat burning/itching  . Rectal discharge    HPI Chad Bolton is a 25 y.o. male.   HPI   STI screening: Pt reports that after unprotected sexual intercourse about 3 days ago he is having rectal discharge, sore throat and dysuria. He reports that symptoms feel similar to when he has chlamydia and gonorrhea. Rectal discharge is green/yellow and mild in nature. He is still able to have normal bowel movements. No testicular pain, abdominal pain, fever, other cold symptoms. He has not tried anything for symptoms.   Past Medical History:  Diagnosis Date  . Gonorrhea    resolved (2019)    Patient Active Problem List   Diagnosis Date Noted  . Abscess of face 02/12/2020  . Rectal ulceration 09/13/2018    Past Surgical History:  Procedure Laterality Date  . COLONOSCOPY WITH PROPOFOL N/A 09/12/2018   Procedure: COLONOSCOPY WITH PROPOFOL;  Surgeon: Carman Ching, MD;  Location: [facility];  Service: Endoscopy;  Laterality: N/A;       Home Medications    Prior to Admission medications   Medication Sig Start Date End Date Taking? Authorizing Provider  ADDERALL XR 15 MG 24 hr capsule Take 15 mg by mouth every morning. 10/22/20   [provider]  Bacillus Coagulans-Inulin (PROBIOTIC FORMULA PO) Take 1 capsule by mouth daily.    [provider]  FLUoxetine (PROZAC) 10 MG capsule Take 10 mg by mouth every morning. 10/10/20   [provider]  naproxen (NAPROSYN) 500 MG tablet Take 1 tablet (500 mg total) by mouth 2 (two) times daily with a meal. 10/30/20   Muthersbaugh, Dahlia Client, PA-C  traZODone (DESYREL) 100 MG tablet Take 100 mg by mouth at bedtime. 10/22/20   [provider]  potassium chloride SA (K-DUR,KLOR-CON) 20 MEQ tablet Take 1 tablet (20 mEq total) by mouth 2 (two) times daily for 5  days. Patient not taking: Reported on 02/24/2019 11/28/18 12/15/19  Barkley Boards, PA-C    Family History History reviewed. No pertinent family history.  Social History Social History   Tobacco Use  . Smoking status: Current Some Day Smoker  . Smokeless tobacco: Never Used  Vaping Use  . Vaping Use: Never used  Substance Use Topics  . Alcohol use: Yes  . Drug use: Never     Allergies   Oxycodone and Buspirone   Review of Systems Review of Systems  As stated above in HPI Physical Exam Triage Vital Signs ED Triage Vitals  Enc Vitals Group     BP 11/28/20 1354 117/75     Pulse Rate 11/28/20 1353 90     Resp 11/28/20 1353 18     Temp 11/28/20 1353 97.7 F (36.5 C)     Temp src --      SpO2 11/28/20 1353 98 %     Weight --      Height --      Head Circumference --      Peak Flow --      Pain Score 11/28/20 1352 7     Pain Loc --      Pain Edu? --      Excl. in GC? --    No data found.  Updated Vital Signs BP 117/75   Pulse 90   Temp 97.7 F (36.5 C)  Resp 18   SpO2 98%   Physical Exam Vitals and nursing note reviewed.  Constitutional:      General: He is not in acute distress.    Appearance: Normal appearance. He is not ill-appearing, toxic-appearing or diaphoretic.  HENT:     Head: Normocephalic and atraumatic.     Nose: Nose normal.     Mouth/Throat:     Mouth: Mucous membranes are moist.     Pharynx: No oropharyngeal exudate or posterior oropharyngeal erythema.  Eyes:     Extraocular Movements: Extraocular movements intact.     Pupils: Pupils are equal, round, and reactive to light.  Cardiovascular:     Rate and Rhythm: Normal rate and regular rhythm.     Heart sounds: Normal heart sounds.  Pulmonary:     Effort: Pulmonary effort is normal.     Breath sounds: Normal breath sounds.  Abdominal:     General: Bowel sounds are normal. There is no distension.     Palpations: Abdomen is soft.     Tenderness: There is no abdominal tenderness.  There is no right CVA tenderness, left CVA tenderness or guarding.  Genitourinary:    Comments: Pt prefers to perform self swab collection Musculoskeletal:     Cervical back: Normal range of motion and neck supple. No rigidity.  Lymphadenopathy:     Cervical: Cervical adenopathy present.  Neurological:     Mental Status: He is alert and oriented to person, place, and time.  Psychiatric:        Mood and Affect: Mood normal.        Behavior: Behavior normal.      UC Treatments / Results  Labs (all labs ordered are listed, but only abnormal results are displayed) Labs Reviewed  CYTOLOGY, (ORAL, ANAL, URETHRAL) ANCILLARY ONLY  CYTOLOGY, (ORAL, ANAL, URETHRAL) ANCILLARY ONLY  CYTOLOGY, (ORAL, ANAL, URETHRAL) ANCILLARY ONLY    EKG   Radiology No results found.  Procedures Procedures (including critical care time)  Medications Ordered in UC Medications - No data to display  Initial Impression / Assessment and Plan / UC Course  I have reviewed the triage vital signs and the nursing notes.  Pertinent labs & imaging results that were available during my care of the patient were reviewed by me and considered in my medical decision making (see chart for details).     New. STI screening today and will treat as indicated once labs return. Discussed red flag signs and symptoms. He will need continued screenings as we discussed that some STIs can take up to 6 months before becoming positive.    Final Clinical Impressions(s) / UC Diagnoses   Final diagnoses:  None   Discharge Instructions   None    ED Prescriptions    None     PDMP not reviewed this encounter.   [name], [place] 11/28/20 1438

## 2020-11-28 NOTE — ED Triage Notes (Signed)
Pt in with c/o rectal discharge, throat burning and itching and penile burning after sexual Intercourse about 3 days ago

## 2020-11-29 LAB — RPR: RPR Ser Ql: NONREACTIVE

## 2020-12-01 ENCOUNTER — Ambulatory Visit (HOSPITAL_COMMUNITY)
Admission: EM | Admit: 2020-12-01 | Discharge: 2020-12-01 | Disposition: A | Discharge status: Home / Self Care | Payer: BC Managed Care – PPO | Attending: Emergency Medicine | Admitting: Emergency Medicine

## 2020-12-01 ENCOUNTER — Other Ambulatory Visit: Payer: Self-pay

## 2020-12-01 DIAGNOSIS — A549 Gonococcal infection, unspecified: Secondary | ICD-10-CM | POA: Diagnosis not present

## 2020-12-01 LAB — CYTOLOGY, (ORAL, ANAL, URETHRAL) ANCILLARY ONLY
Chlamydia: NEGATIVE
Chlamydia: NEGATIVE
Chlamydia: NEGATIVE
Comment: NEGATIVE
Comment: NEGATIVE
Comment: NEGATIVE
Comment: NEGATIVE
Comment: NEGATIVE
Comment: NEGATIVE
Comment: NORMAL
Comment: NORMAL
Comment: NORMAL
Neisseria Gonorrhea: NEGATIVE
Neisseria Gonorrhea: NEGATIVE
Neisseria Gonorrhea: POSITIVE — AB
Trichomonas: NEGATIVE
Trichomonas: NEGATIVE
Trichomonas: NEGATIVE

## 2020-12-01 MED ORDER — LIDOCAINE HCL (PF) 1 % IJ SOLN
INTRAMUSCULAR | Status: AC
  Filled 2020-12-01: qty 2

## 2020-12-01 MED ORDER — CEFTRIAXONE SODIUM 500 MG IJ SOLR
500.0000 mg | Freq: Once | INTRAMUSCULAR | Status: AC
  Administered 2020-12-01: 500 mg via INTRAMUSCULAR

## 2020-12-01 MED ORDER — CEFTRIAXONE SODIUM 500 MG IJ SOLR
INTRAMUSCULAR | Status: AC
  Filled 2020-12-01: qty 500

## 2020-12-01 NOTE — ED Triage Notes (Signed)
Pt presents for STD tx.

## 2020-12-05 NOTE — Telephone Encounter (Signed)
Per chart, patient was already seen at another GI practice.

## 2021-12-17 IMAGING — CR DG CHEST 2V
2 series · 2 of 2 positions shown · non-contrast
Comparison: 11/28/2018

CLINICAL DATA: Intermittent left-sided chest pain and shortness of
breath for 3 days, worsening tonight

EXAM:
CHEST - 2 VIEW

[chest pa]
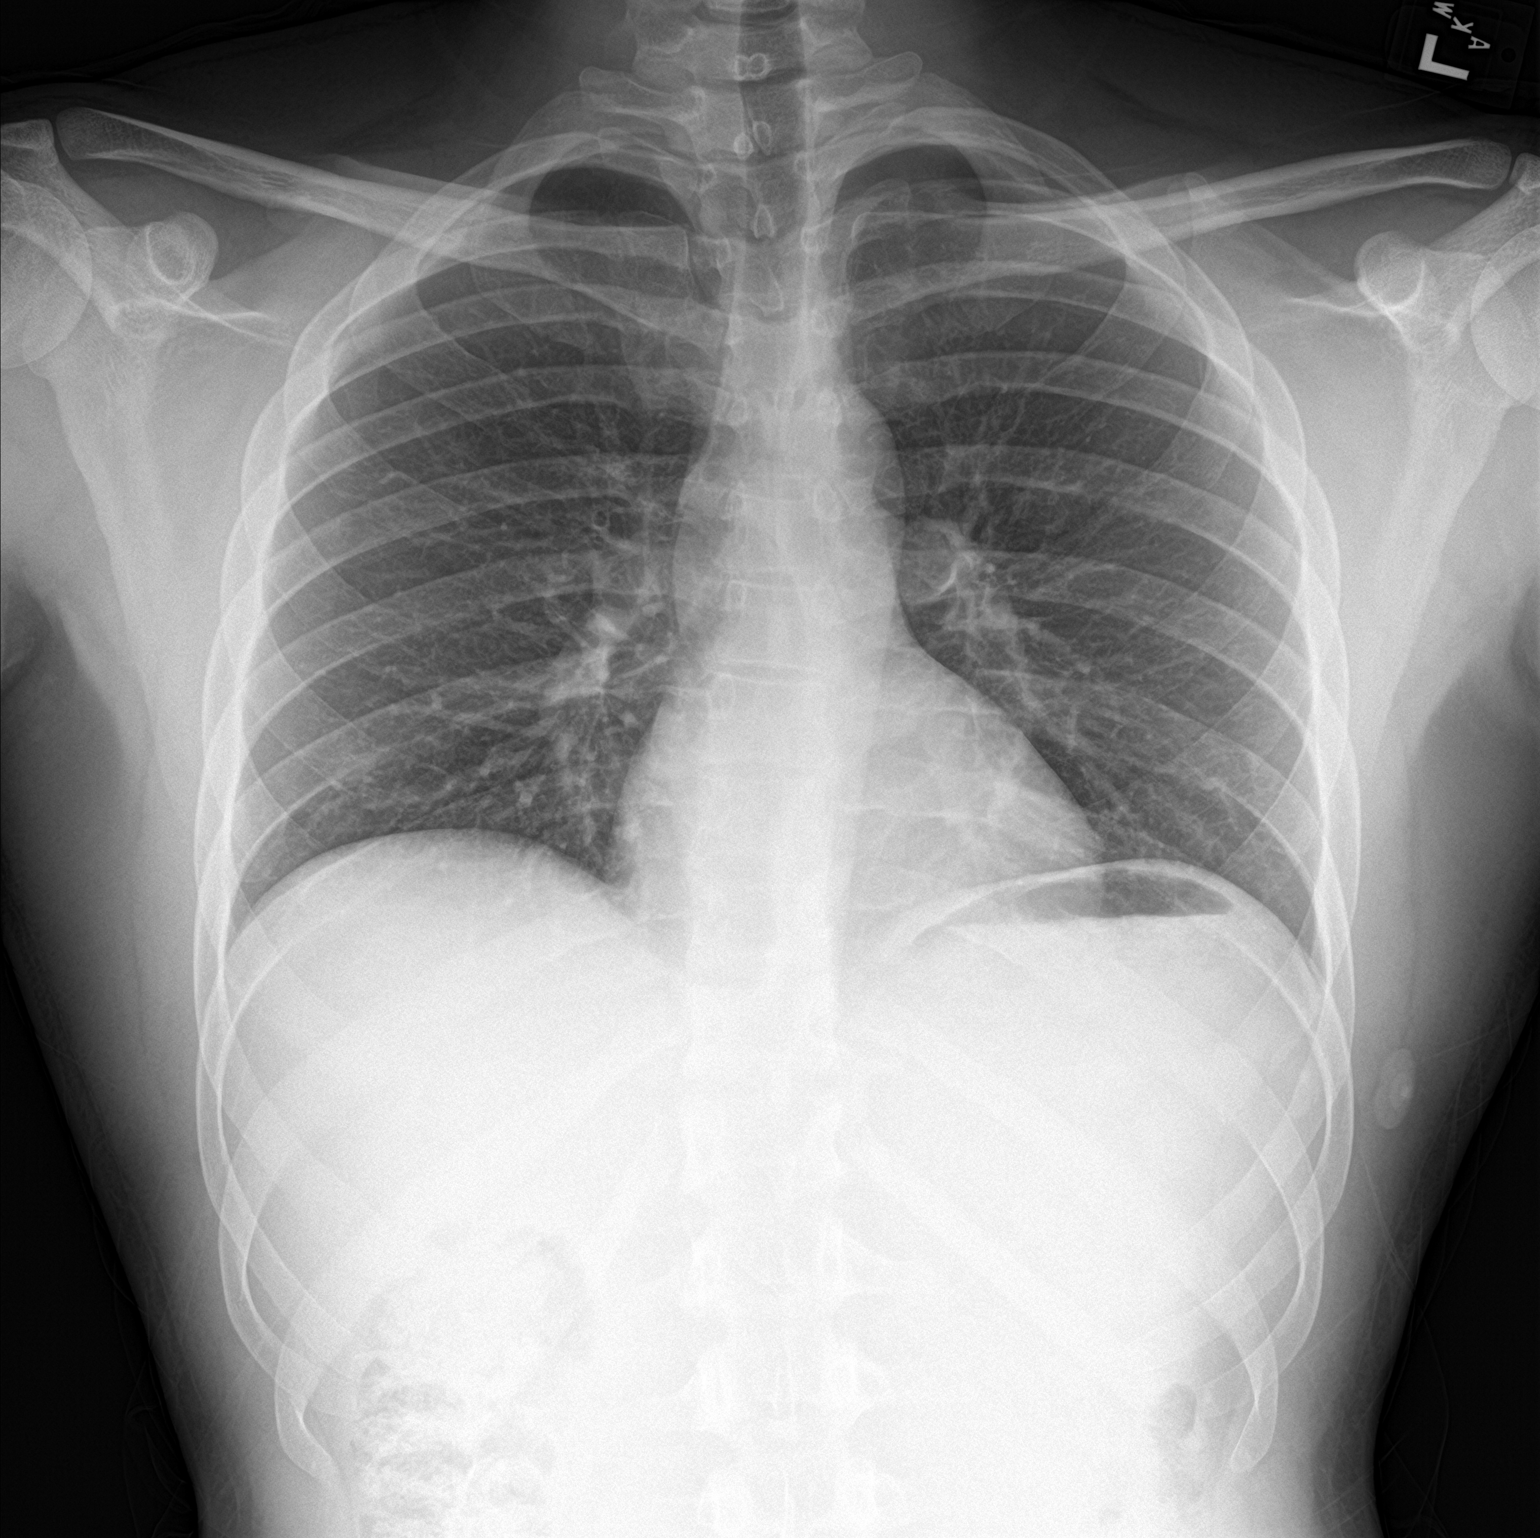

[chest lat]
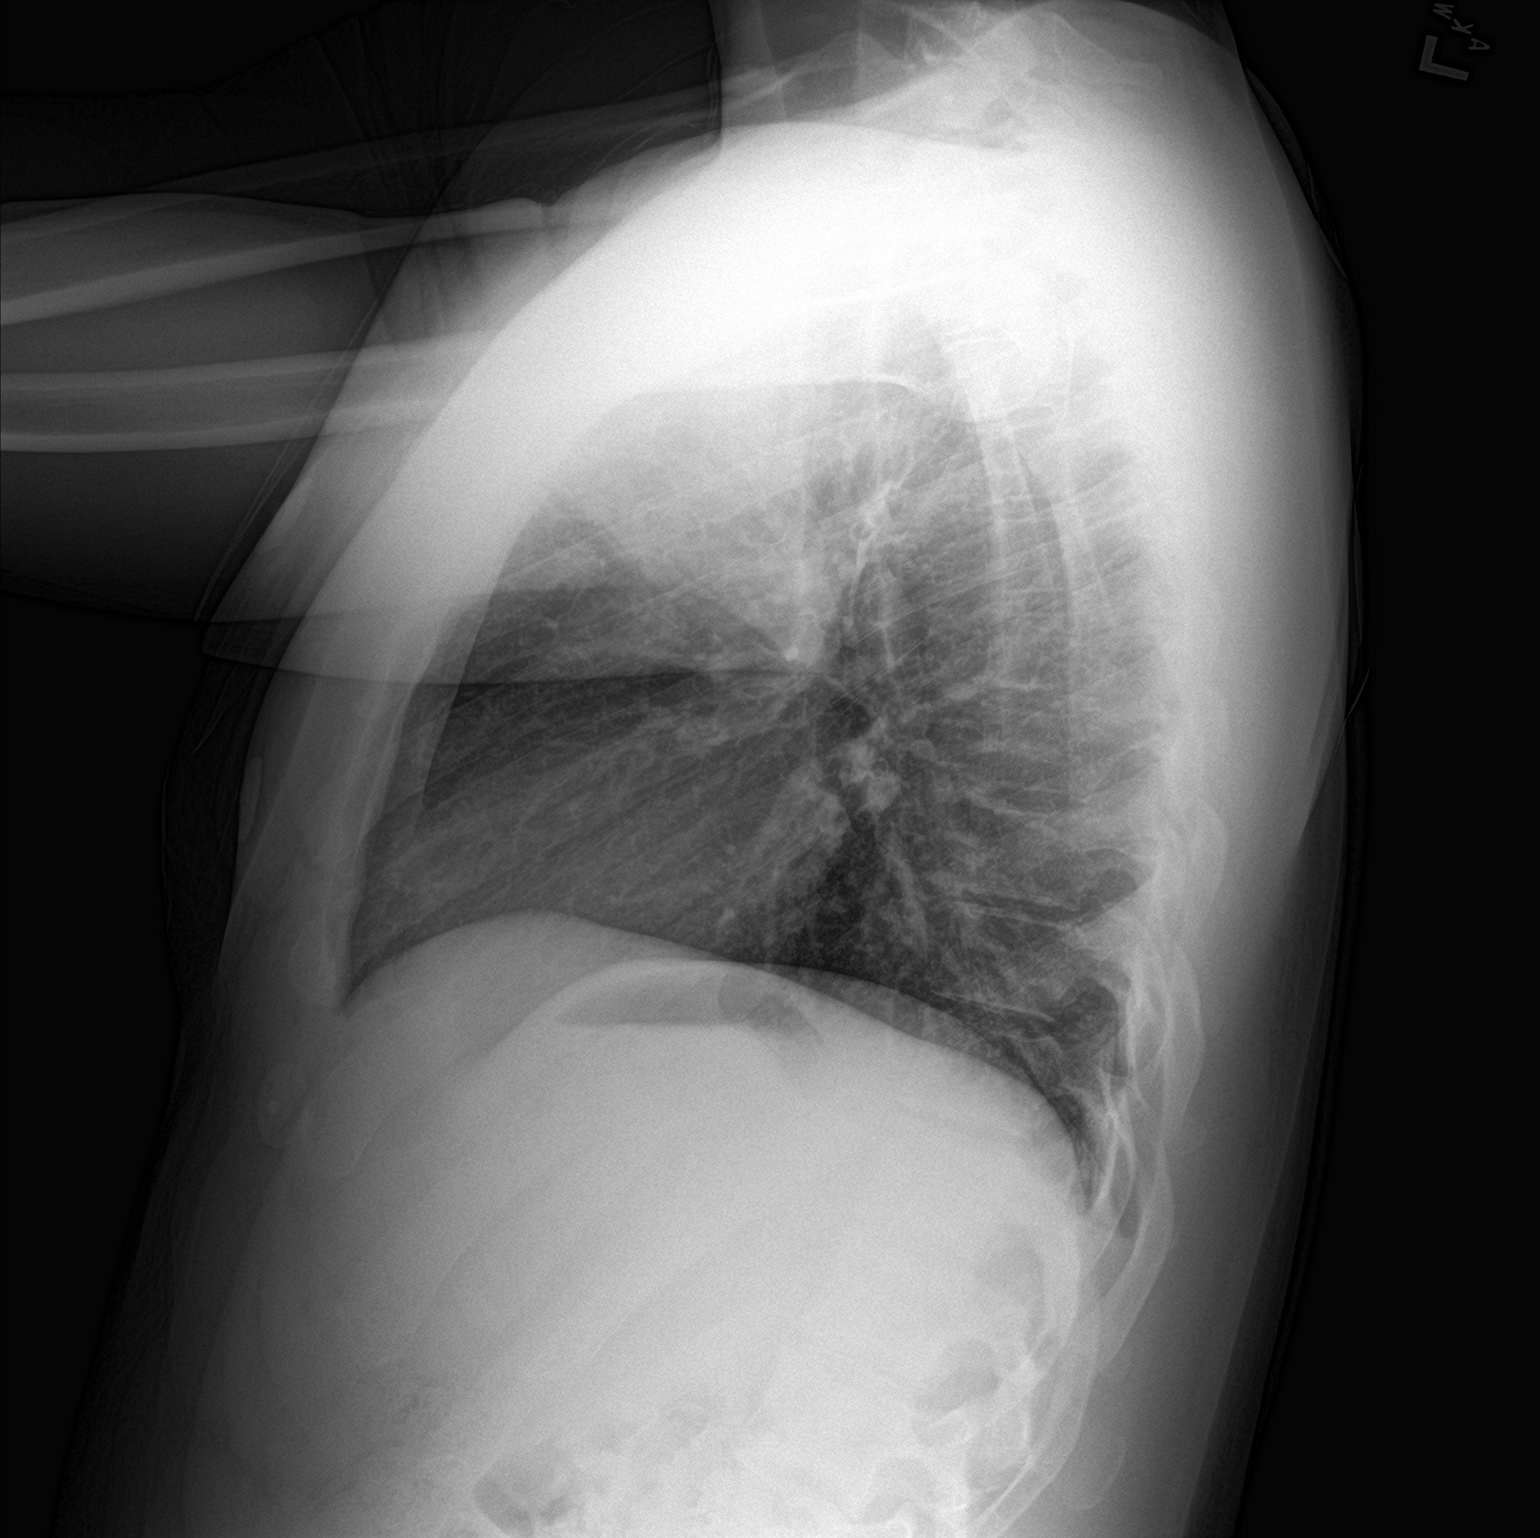

[2 of 2 positions shown; findings below may reference images not displayed]

FINDINGS: The heart size and mediastinal contours are within normal limits.
Both lungs are clear. The visualized skeletal structures are
unremarkable.
IMPRESSION: No active cardiopulmonary disease.
